# Patient Record
Sex: Female | Born: 1970
Health system: Southern US, Community
[De-identification: ages and names within clinical notes are randomized; demographics above are authoritative.]

## PROBLEM LIST (undated history)

## (undated) DIAGNOSIS — F319 Bipolar disorder, unspecified: Secondary | ICD-10-CM

## (undated) DIAGNOSIS — E785 Hyperlipidemia, unspecified: Secondary | ICD-10-CM

## (undated) DIAGNOSIS — K529 Noninfective gastroenteritis and colitis, unspecified: Secondary | ICD-10-CM

## (undated) HISTORY — DX: Noninfective gastroenteritis and colitis, unspecified: K52.9

## (undated) HISTORY — DX: Hyperlipidemia, unspecified: E78.5

## (undated) HISTORY — DX: Bipolar disorder, unspecified: F31.9

---

## 2016-08-03 ENCOUNTER — Ambulatory Visit (INDEPENDENT_AMBULATORY_CARE_PROVIDER_SITE_OTHER): Payer: BLUE CROSS/BLUE SHIELD

## 2016-08-03 ENCOUNTER — Ambulatory Visit (INDEPENDENT_AMBULATORY_CARE_PROVIDER_SITE_OTHER): Payer: BLUE CROSS/BLUE SHIELD | Admitting: Physician Assistant

## 2016-08-03 ENCOUNTER — Encounter: Payer: Self-pay | Admitting: Physician Assistant

## 2016-08-03 VITALS — BP 121/77 | HR 99 | Ht 63.0 in | Wt 160.0 lb

## 2016-08-03 DIAGNOSIS — E042 Nontoxic multinodular goiter: Secondary | ICD-10-CM | POA: Diagnosis not present

## 2016-08-03 DIAGNOSIS — F3181 Bipolar II disorder: Secondary | ICD-10-CM

## 2016-08-03 DIAGNOSIS — Z23 Encounter for immunization: Secondary | ICD-10-CM | POA: Diagnosis not present

## 2016-08-03 DIAGNOSIS — K52839 Microscopic colitis, unspecified: Secondary | ICD-10-CM

## 2016-08-03 DIAGNOSIS — L659 Nonscarring hair loss, unspecified: Secondary | ICD-10-CM | POA: Diagnosis not present

## 2016-08-03 NOTE — Addendum Note (Signed)
Addended by: Jomarie LongsBREEBACK, JADE L on: 08/03/2016 02:49 PM   Modules accepted: Orders

## 2016-08-03 NOTE — Patient Instructions (Signed)
Alopecia Areata  Alopecia areata is a type of hair loss. If you have this condition, you may lose hair on your scalp in patches. In some cases, you may lose all the hair on your scalp (alopecia totalis) or all the hair from your face and body (alopecia universalis).   Alopecia areata is an autoimmune disease. This means your body's defense system (immune system) mistakes normal parts of your body for germs or other things that can make you sick. When you have alopecia areata, your immune system attacks your hair follicles.   Alopecia areata often starts during childhood but can occur at any age. Alopecia areata is not a danger to your health but can be stressful.   CAUSES   The cause of alopecia areata is unknown.   RISK FACTORS  You may be at higher risk of alopecia areata if you:   · Have a family history of alopecia.  · Have a family history of another autoimmune disease, including type 1 diabetes and rheumatoid arthritis.  SIGNS AND SYMPTOMS  Signs of alopecia areata may include:  · Loss of scalp hair in small, round patches. These may be about the size of a quarter.  · Loss of all hair on your scalp.  · Loss of eyebrow hair, facial hair, or the hair inside your nose (nasal hair).  · Hair loss over your entire body.  DIAGNOSIS   Alopecia areata may be diagnosed by:  · Medical history and physical exam.  · Taking a sample of hair to check under a microscope.  · Taking a small piece of skin (biopsy) to examine under a microscope.  · Blood tests to rule out other autoimmune diseases.  TREATMENT   There is no cure for alopecia areata, but the disease often goes away over time. You will not lose the ability to regrow hair. Some medicines may help your hair regrow more quickly. These include:  · Corticosteroids. These block inflammation caused by your immune system. You may get this medicine as a lotion for your skin or as an injection.  · Minoxidil. This is a hair growth medicine you can use in areas of hair  loss.  · Anthralin. This is a medicine for a skin inflammation called psoriasis that may also help alopecia.  · Diphencyprone. This medicine is applied to your skin and may stimulate hair growth.  HOME CARE INSTRUCTIONS  · Use sunscreen or cover your head when outdoors.  · Take medicines only as directed by your health care provider.  · If you have lost your eyebrows, wear sunglasses outside to keep dust out of your eyes.  · If you have lost hair inside your nose, wear a kerchief over your face or apply ointment to the inside of your nose. This keeps out dust and other irritants.  · Keep all follow-up visits as directed by your health care provider. This is important.  SEEK MEDICAL CARE IF:  · Your symptoms change.  · You have new symptoms.  · You have a reaction to your medicines.  · You are struggling emotionally.     This information is not intended to replace advice given to you by your health care provider. Make sure you discuss any questions you have with your health care provider.     Document Released: 06/09/2004 Document Revised: 11/26/2014 Document Reviewed: 01/25/2014  Elsevier Interactive Patient Education ©2016 [employer]

## 2016-08-03 NOTE — Progress Notes (Addendum)
Subjective:     Patient ID: Sara Hurst, female   DOB: [birth year], 45 y.o.   MRN: [id number]  HPI Patient is a 45 y.o. Caucasian female presenting today to establish care with no acute complaints. Patient states that she recently moved from [place] and wanted to establish to be monitored for her thyroid nodule. Patient states that she was diagnosed with a thyroid nodule secondary to feeling a lump in her throat in 2005.  Patient states that she has never had any abnormal thyroid lab values or any symptoms of thyroid dysfunction. Patient reports that she had her last physical in January and is currently on her menstrual period (08/01/2016 start date). Patient states that she is having some thinning of her hair but it is not particularly worrisome to her. Patient notes that she was previously diagnosed with bipolar disorder and believes it is type 2. Patient states she has not had any symptoms since 2005 and is currently well managed on her medication regimen. Patient denies increased fatigue, fever, chest pain, palpitations, difficulty swallowing, or shortness of breath.   Review of Systems  Constitutional: Negative.   HENT: Negative.        Positive hair loss.   Eyes: Negative.   Respiratory: Negative.   Cardiovascular: Negative.   Gastrointestinal: Negative.   Endocrine: Negative.   Genitourinary: Negative.   Musculoskeletal: Negative.   Neurological: Negative.   Psychiatric/Behavioral: Negative.        Objective:   Physical Exam  Constitutional: She is oriented to person, place, and time. She appears well-developed and well-nourished. No distress.  HENT:  Head: Normocephalic and atraumatic.  Right Ear: External ear normal.  Left Ear: External ear normal.  Nose: Nose normal.  Mouth/Throat: Oropharynx is clear and moist.  Eyes: Conjunctivae and EOM are normal. Pupils are equal, round, and reactive to light.  Neck: Normal range of motion. Neck supple. No JVD present. No tracheal  deviation present. No thyromegaly present.  Cardiovascular: Normal rate, regular rhythm and intact distal pulses.  Exam reveals no gallop and no friction rub.   No murmur heard. Pulmonary/Chest: Effort normal and breath sounds normal. No stridor. No respiratory distress. She has no wheezes. She has no rales. She exhibits no tenderness.  Lymphadenopathy:    She has no cervical adenopathy.  Neurological: She is alert and oriented to person, place, and time. No cranial nerve deficit. Coordination normal.  Skin: Skin is warm and dry. No rash noted. She is not diaphoretic. No erythema. No pallor.  Psychiatric: She has a normal mood and affect. Her behavior is normal. Judgment and thought content normal.      Assessment:     Sara Hurst was seen today for establish care.  Diagnoses and all orders for this visit:  Multiple thyroid nodules -     TSH -     T4, free -     US Soft Tissue Head/Neck; Future  Influenza vaccine needed -     Flu Vaccine QUAD 36+ mos PF IM (Fluarix & Fluzone Quad PF)  Bipolar 2 disorder, major depressive episode (HCC)  Hair loss  Microscopic colitis, unspecified microscopic colitis type      Plan:     1. Multiple thyroid nodule - Patient to continue with current management including yearly ultrasound for monitoring. Patient to obtain labwork for TSH and T4 for evaluation of hair loss. Will contact patient with laboratory results.   2. Hair loss- Discussed with patient symptomatic over-the-counter treatments such as Biotin and multivitamins.  Patient advised to consider use of Rogaine if symptoms persist or worsen. Patient given electronic laboratory requisition form for TSH and T4 to evaluate potential thyroid etiology. Will continue to monitor.  3. Bipolar 2 disorder - Patient to continue current medication management with Abilify 5mg  tablets at this time. Patient referred to psychiatry for chronic medical management.   4. Microscopic colitis - Patient states that  she is well controlled at this time. Patient deferred referral to GI specialist. Will continue to monitor.  Summary - Patient received flu-shot in clinic today. Patient to follow-up in January for annual physical exam. Will call patient with laboratory and imaging results.

## 2016-08-04 LAB — TSH: TSH: 2.78 m[IU]/L

## 2016-08-04 LAB — T4, FREE: FREE T4: 1.2 ng/dL (ref 0.8–1.8)

## 2016-10-04 LAB — BASIC METABOLIC PANEL
BUN: 13 mg/dL (ref 4–21)
Creatinine: 0.8 mg/dL (ref 0.5–1.1)
Glucose: 101 mg/dL
POTASSIUM: 4.4 mmol/L (ref 3.4–5.3)
Sodium: 141 mmol/L (ref 137–147)

## 2016-10-04 LAB — HEPATIC FUNCTION PANEL
ALT: 43 U/L — AB (ref 7–35)
AST: 24 U/L (ref 13–35)
Alkaline Phosphatase: 81 U/L (ref 25–125)
Bilirubin, Total: 0.4 mg/dL

## 2016-10-04 LAB — CBC AND DIFFERENTIAL
HCT: 43 % (ref 36–46)
HEMOGLOBIN: 14.7 g/dL (ref 12.0–16.0)
Neutrophils Absolute: 5 /uL
Platelets: 190 10*3/uL (ref 150–399)
WBC: 7.5 10*3/mL

## 2016-10-04 LAB — LIPID PANEL
CHOLESTEROL: 235 mg/dL — AB (ref 0–200)
HDL: 42 mg/dL (ref 35–70)
LDL Cholesterol: 149 mg/dL
Triglycerides: 221 mg/dL — AB (ref 40–160)

## 2016-10-04 LAB — TSH: TSH: 3.07 u[IU]/mL (ref 0.41–5.90)

## 2016-10-04 LAB — HEMOGLOBIN A1C: HEMOGLOBIN A1C: 5.4

## 2017-02-04 ENCOUNTER — Encounter: Payer: Self-pay | Admitting: Physician Assistant

## 2017-02-04 ENCOUNTER — Telehealth: Payer: Self-pay | Admitting: Physician Assistant

## 2017-02-04 ENCOUNTER — Ambulatory Visit (INDEPENDENT_AMBULATORY_CARE_PROVIDER_SITE_OTHER): Payer: BLUE CROSS/BLUE SHIELD | Admitting: Physician Assistant

## 2017-02-04 VITALS — BP 125/81 | HR 98 | Temp 98.2°F | Ht 63.0 in | Wt 156.0 lb

## 2017-02-04 DIAGNOSIS — J0100 Acute maxillary sinusitis, unspecified: Secondary | ICD-10-CM

## 2017-02-04 DIAGNOSIS — F3181 Bipolar II disorder: Secondary | ICD-10-CM | POA: Diagnosis not present

## 2017-02-04 MED ORDER — AMOXICILLIN-POT CLAVULANATE 875-125 MG PO TABS: 1.0000 | ORAL_TABLET | Freq: Two times a day (BID) | ORAL | 0 refills | Status: DC

## 2017-02-04 MED ORDER — ARIPIPRAZOLE 5 MG PO TABS: 5.0000 mg | ORAL_TABLET | Freq: Every day | ORAL | 1 refills | Status: DC

## 2017-02-04 NOTE — Progress Notes (Signed)
   Subjective:    Patient ID: Sara Hurst, female    DOB: 02-14-1971, 46 y.o.   MRN: [id number]  HPI  Pt is a 46 yo female who presents to the clinic with 2 weeks of sinus pressure, facial pain, nasal congestion. She has been on mucinex, nyquil and dayquil. Denies any fever, chills, SOB or wheezing. She has productive sputum coming out of nose and throat. She has had dull headaches past few days and episodes of dizziness.   Sara Hurst pap and labs.    Review of Systems  All other systems reviewed and are negative.      Objective:   Physical Exam  Constitutional: She is oriented to person, place, and time. She appears well-developed and well-nourished.  HENT:  Head: Normocephalic and atraumatic.  Right Ear: External ear normal.  Left Ear: External ear normal.  TM's erythematous.  Oropharynx erythematous with PND.  Bilateral nasal turbinates red and swollen.  Tenderness over maxillary sinuses to palpation.   Eyes: Conjunctivae are normal. Right eye exhibits no discharge. Left eye exhibits no discharge.  Neck: Normal range of motion. Neck supple.  Cardiovascular: Normal rate, regular rhythm and normal heart sounds.   Pulmonary/Chest: Effort normal and breath sounds normal.  Lymphadenopathy:    She has no cervical adenopathy.  Neurological: She is alert and oriented to person, place, and time.  Psychiatric: She has a normal mood and affect. Her behavior is normal.          Assessment & Plan:  Marland Kitchen.Marland Kitchen.Sara Hurst was seen today for uri.  Diagnoses and all orders for this visit:  Acute non-recurrent maxillary sinusitis -     amoxicillin-clavulanate (AUGMENTIN) 875-125 MG tablet; Take 1 tablet by mouth 2 (two) times daily. Take for 10 days.  Bipolar 2 disorder, major depressive episode (HCC) -     ARIPiprazole (ABILIFY) 5 MG tablet; Take 1 tablet (5 mg total) by mouth daily.   Discussed symptomatic care of sinusitis.  Refilled abilify for 6 months.    Will call to get last pap  and lab results.   Follow up in 6 months.

## 2017-02-04 NOTE — Patient Instructions (Signed)

## 2017-02-04 NOTE — Telephone Encounter (Signed)
Pt got labs and pap done at [facility]. Can we call and get records?

## 2017-02-05 NOTE — Telephone Encounter (Signed)
Request faxed

## 2017-02-07 ENCOUNTER — Encounter: Payer: Self-pay | Admitting: Physician Assistant

## 2017-02-07 LAB — URINALYSIS
BILIRUBIN (URINE): NEGATIVE
Bilirubin (Urine): NEGATIVE
Blood, UA: NEGATIVE
Blood, UA: NEGATIVE
Glucose, UA: NEGATIVE
Glucose, UA: NEGATIVE
Ketones, UA: NEGATIVE
Ketones, UA: NEGATIVE
Leukocyte Concentration: NEGATIVE
Leukocytes, UA: NEGATIVE
Nitrite, UA: NEGATIVE
Nitrite, UA: NEGATIVE
PH: 5
PROTEIN UA: NEGATIVE
Protein, Urine: NEGATIVE
SPEC GRAV UA: 1.015
Specific Gravity, UA: 1.005
UROBILINOGEN UA: NORMAL
UROBILINOGEN UA: NORMAL
pH, UA: 5 (ref 4.5–8.0)

## 2017-02-07 LAB — UPPER ENDOSCOPY: Urine Culture, Routine: NO GROWTH

## 2017-03-15 ENCOUNTER — Telehealth: Payer: Self-pay | Admitting: *Deleted

## 2017-03-15 ENCOUNTER — Ambulatory Visit (INDEPENDENT_AMBULATORY_CARE_PROVIDER_SITE_OTHER): Payer: BLUE CROSS/BLUE SHIELD | Admitting: Physician Assistant

## 2017-03-15 VITALS — BP 128/82 | HR 92 | Ht 62.0 in | Wt 160.0 lb

## 2017-03-15 DIAGNOSIS — Z0000 Encounter for general adult medical examination without abnormal findings: Secondary | ICD-10-CM

## 2017-03-15 DIAGNOSIS — N6029 Fibroadenosis of unspecified breast: Secondary | ICD-10-CM

## 2017-03-15 DIAGNOSIS — Z23 Encounter for immunization: Secondary | ICD-10-CM

## 2017-03-15 DIAGNOSIS — Z87898 Personal history of other specified conditions: Secondary | ICD-10-CM | POA: Diagnosis not present

## 2017-03-15 NOTE — Patient Instructions (Signed)

## 2017-03-15 NOTE — Telephone Encounter (Signed)
Patient is scheduled for a CPE ( biometric screen ) today but the appointment is this afternoon. She would like the lab order placed so that she could go ahead and get labs done. Order placed

## 2017-03-15 NOTE — Progress Notes (Signed)
   Subjective:    Patient ID: Sara Hurst, female    DOB: [birth year], 46 y.o.   MRN: [id number]  HPI    Review of Systems     Objective:   Physical Exam        Assessment & Plan:   Subjective:     Sara Hurst is a 46 y.o. female and is here for a comprehensive physical exam. The patient reports no problems.  Social History   Social History  . Marital status: Single    Spouse name: N/A  . Number of children: N/A  . Years of education: N/A   Occupational History  . Not on file.   Social History Main Topics  . Smoking status: Never Smoker  . Smokeless tobacco: Never Used  . Alcohol use Yes  . Drug use: No  . Sexual activity: Yes   Other Topics Concern  . Not on file   Social History Narrative  . No narrative on file   Health Maintenance  Topic Date Due  . HIV Screening  03/15/2018 (Originally 06/05/1986)  . INFLUENZA VACCINE  06/19/2017  . PAP SMEAR  09/12/2019  . TETANUS/TDAP  03/16/2027      Review of Systems A comprehensive review of systems was negative.   Objective:    BP 128/82   Pulse 92   Ht  (1.575 m)   Wt 160 lb (72.6 kg)   BMI 29.26 kg/m  General appearance: alert, cooperative and appears stated age Head: Normocephalic, without obvious abnormality, atraumatic Eyes: conjunctivae/corneas clear. PERRL, EOM's intact. Fundi benign. Ears: normal TM's and external ear canals both ears Nose: Nares normal. Septum midline. Mucosa normal. No drainage or sinus tenderness. Throat: lips, mucosa, and tongue normal; teeth and gums normal Neck: no adenopathy, no carotid bruit, no JVD, supple, symmetrical, trachea midline and thyroid not enlarged, symmetric, no tenderness/mass/nodules Back: symmetric, no curvature. ROM normal. No CVA tenderness. Lungs: clear to auscultation bilaterally Breasts: normal appearance, no masses or tenderness Heart: regular rate and rhythm, S1, S2 normal, no murmur, click, rub or gallop Abdomen: soft,  non-tender; bowel sounds normal; no masses,  no organomegaly Extremities: extremities normal, atraumatic, no cyanosis or edema Pulses: 2+ and symmetric Skin: Skin color, texture, turgor normal. No rashes or lesions Lymph nodes: Cervical, supraclavicular, and axillary nodes normal. Neurologic: Alert and oriented X 3, normal strength and tone. Normal symmetric reflexes. Normal coordination and gait    Assessment:    Healthy female exam.      Plan:    Marland KitchenMarland KitchenAlisse was seen today for annual exam.  Diagnoses and all orders for this visit:  Routine physical examination  Immunization due  Segmental fibroadenosis of breast, unspecified laterality -     MM DIAG BREAST TOMO BILATERAL; Future  History of abnormal mammogram -     MM DIAG BREAST TOMO BILATERAL; Future  Other orders -     Tdap vaccine greater than or equal to 7yo IM  .Marland Kitchen Depression screen [facility] 2/9 03/15/2017 02/04/2017  Decreased Interest 0 0  Down, Depressed, Hopeless 0 0  PHQ - 2 Score 0 0    Discussed weight loss and plan.  Encouraged vitamin D1000 units and calcium 1300.  See After Visit Summary for Counseling Recommendations

## 2017-03-16 LAB — CBC
HCT: 42.5 % (ref 35.0–45.0)
Hemoglobin: 14.2 g/dL (ref 11.7–15.5)
MCH: 29.9 pg (ref 27.0–33.0)
MCHC: 33.4 g/dL (ref 32.0–36.0)
MCV: 89.5 fL (ref 80.0–100.0)
MPV: 11 fL (ref 7.5–12.5)
Platelets: 212 10*3/uL (ref 140–400)
RBC: 4.75 MIL/uL (ref 3.80–5.10)
RDW: 13 % (ref 11.0–15.0)
WBC: 10.6 10*3/uL (ref 3.8–10.8)

## 2017-03-16 LAB — COMPLETE METABOLIC PANEL WITH GFR
AG RATIO: 1.6 ratio (ref 1.0–2.5)
ALBUMIN: 4.4 g/dL (ref 3.6–5.1)
ALK PHOS: 48 U/L (ref 33–115)
ALT: 17 U/L (ref 6–29)
AST: 17 U/L (ref 10–35)
BILIRUBIN TOTAL: 0.6 mg/dL (ref 0.2–1.2)
BUN/Creatinine Ratio: 13.2 Ratio (ref 6–22)
BUN: 10 mg/dL (ref 7–25)
CALCIUM: 9.5 mg/dL (ref 8.6–10.2)
CO2: 26 mmol/L (ref 20–31)
CREATININE: 0.76 mg/dL (ref 0.50–1.10)
Chloride: 100 mmol/L (ref 98–110)
GFR, Est African American: >89 mL/min (ref >=60)
GLOBULIN: 2.7 g/dL (ref 1.9–3.7)
GLUCOSE: 81 mg/dL (ref 65–99)
Potassium: 3.9 mmol/L (ref 3.5–5.3)
Sodium: 136 mmol/L (ref 135–146)
TOTAL PROTEIN: 7.1 g/dL (ref 6.1–8.1)

## 2017-03-16 LAB — LIPID PANEL
CHOLESTEROL: 232 mg/dL — AB (ref <200)
HDL: 40 mg/dL — ABNORMAL LOW (ref >50)
LDL CALC: 131 mg/dL — AB (ref <100)
TRIGLYCERIDES: 303 mg/dL — AB (ref <150)
Total CHOL/HDL Ratio: 5.8 Ratio — ABNORMAL HIGH (ref <5.0)
VLDL: 61 mg/dL — ABNORMAL HIGH (ref <30)

## 2017-03-17 ENCOUNTER — Encounter: Payer: Self-pay | Admitting: Physician Assistant

## 2017-03-18 ENCOUNTER — Encounter: Payer: Self-pay | Admitting: Physician Assistant

## 2017-03-18 DIAGNOSIS — E781 Pure hyperglyceridemia: Secondary | ICD-10-CM | POA: Insufficient documentation

## 2017-03-18 NOTE — Telephone Encounter (Signed)
Call pt: LdL improved from last year. TG worsened. Certainly Need Fish Oil  for TG. Recheck in 1 year. Low carb/low sugar.

## 2017-05-16 ENCOUNTER — Encounter: Payer: Self-pay | Admitting: Physician Assistant

## 2017-07-05 ENCOUNTER — Encounter: Payer: Self-pay | Admitting: Physician Assistant

## 2017-07-05 ENCOUNTER — Ambulatory Visit (INDEPENDENT_AMBULATORY_CARE_PROVIDER_SITE_OTHER): Payer: BLUE CROSS/BLUE SHIELD | Admitting: Physician Assistant

## 2017-07-05 ENCOUNTER — Ambulatory Visit (INDEPENDENT_AMBULATORY_CARE_PROVIDER_SITE_OTHER): Payer: BLUE CROSS/BLUE SHIELD

## 2017-07-05 VITALS — BP 121/78 | HR 90 | Wt 159.0 lb

## 2017-07-05 DIAGNOSIS — R253 Fasciculation: Secondary | ICD-10-CM | POA: Diagnosis not present

## 2017-07-05 DIAGNOSIS — G43109 Migraine with aura, not intractable, without status migrainosus: Secondary | ICD-10-CM

## 2017-07-05 DIAGNOSIS — L299 Pruritus, unspecified: Secondary | ICD-10-CM | POA: Diagnosis not present

## 2017-07-05 DIAGNOSIS — M542 Cervicalgia: Secondary | ICD-10-CM

## 2017-07-05 DIAGNOSIS — G44209 Tension-type headache, unspecified, not intractable: Secondary | ICD-10-CM | POA: Diagnosis not present

## 2017-07-05 DIAGNOSIS — H539 Unspecified visual disturbance: Secondary | ICD-10-CM | POA: Diagnosis not present

## 2017-07-05 DIAGNOSIS — Z820 Family history of epilepsy and other diseases of the nervous system: Secondary | ICD-10-CM

## 2017-07-05 MED ORDER — CYCLOBENZAPRINE HCL 10 MG PO TABS: 10.0000 mg | ORAL_TABLET | Freq: Three times a day (TID) | ORAL | 0 refills | Status: DC | PRN

## 2017-07-05 NOTE — Progress Notes (Signed)
   Subjective:    Patient ID: Sara Hurst, female    DOB: [birth year], 46 y.o.   MRN: [id number]  HPI  Pt is a 46 yo female who presents to the clinic today with multiple concerning symptoms. Overall her mother has MS and she is concerned she may have it. For the last 6 months she has had on and off different symptoms. Her first symptoms was bilateraly itchy legs without rash that is worse at night, she has noticed her farsightedness has gotten worse, she has had a stiff neck with no injury or activity changes(she does have cervical DDD), she has had new headaches that are "like a band around her head" and  auras with no hx of migraines and for the last week she has had a jaw twitch. She denies any tripping, dropping things or loss of muscle coordination. She tried stopping abilify and did not resolve any symptoms.   .. Active Ambulatory Problems    Diagnosis Date Noted  . Microscopic colitis 08/03/2016  . Hair loss 08/03/2016  . Bipolar 2 disorder, major depressive episode (HCC) 08/03/2016  . Multiple thyroid nodules 08/03/2016  . Hypertriglyceridemia 03/18/2017  . DDD (degenerative disc disease), cervical 07/08/2017  . Migraine with aura and without status migrainosus, not intractable 07/09/2017  . Itching 07/09/2017  . Twitching 07/09/2017  . Family history of MS (multiple sclerosis) 07/09/2017  . Neck pain 07/09/2017  . Vision changes 07/09/2017   Resolved Ambulatory Problems    Diagnosis Date Noted  . No Resolved Ambulatory Problems   Past Medical History:  Diagnosis Date  . Bipolar disorder (HCC)   . Colitis   . Hyperlipidemia      Review of Systems    see HPI.  Objective:   Physical Exam  Constitutional: She is oriented to person, place, and time. She appears well-developed and well-nourished.  HENT:  Head: Normocephalic and atraumatic.  Eyes: Pupils are equal, round, and reactive to light. Conjunctivae and EOM are normal.  Neck: Normal range of motion. Neck  supple.  Cardiovascular: Normal rate, regular rhythm and normal heart sounds.   Pulmonary/Chest: Effort normal and breath sounds normal.  Neurological: She is alert and oriented to person, place, and time. She has normal reflexes. She displays normal reflexes. No cranial nerve deficit. Coordination normal.  Psychiatric: She has a normal mood and affect. Her behavior is normal.          Assessment & Plan:  Marland KitchenMarland KitchenAbrianna was seen today for headache and jaw twitching.  Diagnoses and all orders for this visit:  Neck pain -     cyclobenzaprine (FLEXERIL) 10 MG tablet; Take 1 tablet (10 mg total) by mouth 3 (three) times daily as needed for muscle spasms. -     DG Cervical Spine Complete; Future  Family history of MS (multiple sclerosis)  Twitching  Itching  Migraine with aura and without status migrainosus, not intractable  Vision changes   Xray confirmed DDD. Discussed muscle relaxer, massage therapy, biofreeze, heat and ice.  Would like for patient to have annual eye exam due to vision changes.  Due to family hx and numerous symptoms will get MRI to evaluate for MS.

## 2017-07-08 ENCOUNTER — Encounter: Payer: Self-pay | Admitting: Physician Assistant

## 2017-07-08 DIAGNOSIS — M5030 Other cervical disc degeneration, unspecified cervical region: Secondary | ICD-10-CM | POA: Insufficient documentation

## 2017-07-09 DIAGNOSIS — H539 Unspecified visual disturbance: Secondary | ICD-10-CM | POA: Insufficient documentation

## 2017-07-09 DIAGNOSIS — Z820 Family history of epilepsy and other diseases of the nervous system: Secondary | ICD-10-CM | POA: Insufficient documentation

## 2017-07-09 DIAGNOSIS — M542 Cervicalgia: Secondary | ICD-10-CM | POA: Insufficient documentation

## 2017-07-09 DIAGNOSIS — R253 Fasciculation: Secondary | ICD-10-CM | POA: Insufficient documentation

## 2017-07-09 DIAGNOSIS — L299 Pruritus, unspecified: Secondary | ICD-10-CM | POA: Insufficient documentation

## 2017-07-09 DIAGNOSIS — G43109 Migraine with aura, not intractable, without status migrainosus: Secondary | ICD-10-CM | POA: Insufficient documentation

## 2017-07-09 NOTE — Progress Notes (Signed)
MRI ordered

## 2017-07-11 ENCOUNTER — Encounter: Payer: Self-pay | Admitting: Physician Assistant

## 2017-07-18 ENCOUNTER — Telehealth: Payer: Self-pay | Admitting: Physician Assistant

## 2017-07-18 NOTE — Telephone Encounter (Signed)
[facility] adv they [facility] over Mri results yesterday to our office [facility] and she has Mri done Tuesday and she is requesting to know her results. Pt req a call back [phone]. Thanks

## 2017-07-19 NOTE — Telephone Encounter (Signed)
Who did you give th paper copy too? I will call the patient and let her know results are normal. Thanks

## 2017-07-19 NOTE — Telephone Encounter (Signed)
I gave you paper copy but results are completely normal!!!

## 2017-08-16 ENCOUNTER — Encounter: Payer: Self-pay | Admitting: Physician Assistant

## 2017-08-16 ENCOUNTER — Ambulatory Visit (INDEPENDENT_AMBULATORY_CARE_PROVIDER_SITE_OTHER): Payer: BLUE CROSS/BLUE SHIELD | Admitting: Physician Assistant

## 2017-08-16 VITALS — BP 118/71 | HR 79 | Wt 162.0 lb

## 2017-08-16 DIAGNOSIS — G43109 Migraine with aura, not intractable, without status migrainosus: Secondary | ICD-10-CM | POA: Diagnosis not present

## 2017-08-16 DIAGNOSIS — Z820 Family history of epilepsy and other diseases of the nervous system: Secondary | ICD-10-CM

## 2017-08-16 DIAGNOSIS — H5712 Ocular pain, left eye: Secondary | ICD-10-CM

## 2017-08-16 DIAGNOSIS — R238 Other skin changes: Secondary | ICD-10-CM

## 2017-08-16 DIAGNOSIS — R253 Fasciculation: Secondary | ICD-10-CM

## 2017-08-16 MED ORDER — AMOXICILLIN-POT CLAVULANATE 875-125 MG PO TABS: 1.0000 | ORAL_TABLET | Freq: Two times a day (BID) | ORAL | 0 refills | Status: DC

## 2017-08-16 MED ORDER — JUNEL FE 1/20 1-20 MG-MCG PO TABS: 1.0000 | ORAL_TABLET | Freq: Every day | ORAL | 11 refills | Status: DC

## 2017-08-16 MED ORDER — PREDNISONE 50 MG PO TABS: ORAL_TABLET | ORAL | 0 refills | Status: DC

## 2017-08-16 NOTE — Progress Notes (Signed)
   Subjective:    Patient ID: Sara Hurst, female    DOB: 03/15/1971, 46 y.o.   MRN: [id number]  HPI  Pt is a 46 yo female who presents to the clinic to follow after MRI and eye exam for left eye pain, jaw twitching, migraines. She continues to have these symptoms. Her mother has MS and her paternal grandfather had parkinsons. She continues to be concerned. MrI no acute findings and opthmologist great exam.   .. Active Ambulatory Problems    Diagnosis Date Noted  . Microscopic colitis 08/03/2016  . Hair loss 08/03/2016  . Bipolar 2 disorder, major depressive episode (HCC) 08/03/2016  . Multiple thyroid nodules 08/03/2016  . Hypertriglyceridemia 03/18/2017  . DDD (degenerative disc disease), cervical 07/08/2017  . Migraine with aura and without status migrainosus, not intractable 07/09/2017  . Itching 07/09/2017  . Twitching 07/09/2017  . Family history of MS (multiple sclerosis) 07/09/2017  . Neck pain 07/09/2017  . Vision changes 07/09/2017  . Papule 08/18/2017   Resolved Ambulatory Problems    Diagnosis Date Noted  . No Resolved Ambulatory Problems   Past Medical History:  Diagnosis Date  . Bipolar disorder (HCC)   . Colitis   . Hyperlipidemia      Review of Systems  All other systems reviewed and are negative.      Objective:   Physical Exam  Constitutional: She is oriented to person, place, and time. She appears well-developed and well-nourished.  HENT:  Head: Normocephalic and atraumatic.  Cardiovascular: Normal rate, regular rhythm and normal heart sounds.   Pulmonary/Chest: Effort normal and breath sounds normal.  Neurological: She is alert and oriented to person, place, and time.  Skin:  4 papules with what appears to be some telectangsia surrounding in a linear pattern on nose.   Psychiatric: She has a normal mood and affect. Her behavior is normal.          Assessment & Plan:  Sara KitchenMarland KitchenDemetria was seen today for f/u mri.  Diagnoses and all orders for  this visit:  Left eye pain -     amoxicillin-clavulanate (AUGMENTIN) 875-125 MG tablet; Take 1 tablet by mouth 2 (two) times daily. For 10 days. -     predniSONE (DELTASONE) 50 MG tablet; Take one tablet for 5 days. -     Ambulatory referral to Neurology  Papule -     Ambulatory referral to Dermatology  Twitching -     Ambulatory referral to Neurology  Migraine with aura and without status migrainosus, not intractable -     amoxicillin-clavulanate (AUGMENTIN) 875-125 MG tablet; Take 1 tablet by mouth 2 (two) times daily. For 10 days. -     predniSONE (DELTASONE) 50 MG tablet; Take one tablet for 5 days. -     Ambulatory referral to Neurology  Family history of Parkinson disease -     Ambulatory referral to Neurology  Family history of MS (multiple sclerosis) -     Ambulatory referral to Neurology  Other orders -     JUNEL FE 1/20 1-20 MG-MCG tablet; Take 1 tablet by mouth daily.  reassurance given MRI and eye exam were normal. Symptoms could represent sinus infection. I would like to treat. Referral made to neurology due to family hx.   Concern for papules on nose to be BCC. Made referral.   Pt did need me to send rx for OCP it needs to be written BRAND only to get what she has always been on.

## 2017-08-18 ENCOUNTER — Encounter: Payer: Self-pay | Admitting: Physician Assistant

## 2017-08-18 DIAGNOSIS — R238 Other skin changes: Secondary | ICD-10-CM | POA: Insufficient documentation

## 2017-08-27 ENCOUNTER — Other Ambulatory Visit: Payer: Self-pay | Admitting: Physician Assistant

## 2017-08-27 ENCOUNTER — Encounter: Payer: Self-pay | Admitting: Physician Assistant

## 2017-08-27 DIAGNOSIS — Z87898 Personal history of other specified conditions: Secondary | ICD-10-CM

## 2017-08-27 DIAGNOSIS — N6029 Fibroadenosis of unspecified breast: Secondary | ICD-10-CM

## 2017-09-13 ENCOUNTER — Ambulatory Visit
Admission: RE | Admit: 2017-09-13 | Discharge: 2017-09-13 | Disposition: A | Discharge status: Home / Self Care | Payer: BLUE CROSS/BLUE SHIELD | Source: Ambulatory Visit | Attending: Physician Assistant | Admitting: Physician Assistant

## 2017-09-13 ENCOUNTER — Other Ambulatory Visit: Payer: Self-pay | Admitting: Physician Assistant

## 2017-09-13 ENCOUNTER — Encounter: Payer: Self-pay | Admitting: Physician Assistant

## 2017-09-13 DIAGNOSIS — N6029 Fibroadenosis of unspecified breast: Secondary | ICD-10-CM

## 2017-09-13 DIAGNOSIS — Z87898 Personal history of other specified conditions: Secondary | ICD-10-CM

## 2017-09-13 DIAGNOSIS — N6310 Unspecified lump in the right breast, unspecified quadrant: Secondary | ICD-10-CM

## 2017-09-17 ENCOUNTER — Other Ambulatory Visit: Payer: Self-pay | Admitting: Physician Assistant

## 2017-09-17 DIAGNOSIS — N6310 Unspecified lump in the right breast, unspecified quadrant: Secondary | ICD-10-CM

## 2017-09-18 ENCOUNTER — Other Ambulatory Visit: Payer: Self-pay | Admitting: Physician Assistant

## 2017-09-18 ENCOUNTER — Ambulatory Visit
Admission: RE | Admit: 2017-09-18 | Discharge: 2017-09-18 | Disposition: A | Discharge status: Home / Self Care | Payer: BLUE CROSS/BLUE SHIELD | Source: Ambulatory Visit | Attending: Physician Assistant | Admitting: Physician Assistant

## 2017-09-18 DIAGNOSIS — N6310 Unspecified lump in the right breast, unspecified quadrant: Secondary | ICD-10-CM

## 2018-02-26 ENCOUNTER — Other Ambulatory Visit: Payer: Self-pay | Admitting: Physician Assistant

## 2018-02-26 DIAGNOSIS — F3181 Bipolar II disorder: Secondary | ICD-10-CM

## 2018-03-12 ENCOUNTER — Other Ambulatory Visit: Payer: Self-pay | Admitting: *Deleted

## 2018-03-12 DIAGNOSIS — F3181 Bipolar II disorder: Secondary | ICD-10-CM

## 2018-03-12 MED ORDER — ARIPIPRAZOLE 5 MG PO TABS: 5.0000 mg | ORAL_TABLET | Freq: Every day | ORAL | 0 refills | Status: DC

## 2018-03-21 ENCOUNTER — Ambulatory Visit: Payer: BLUE CROSS/BLUE SHIELD | Admitting: Physician Assistant

## 2018-03-25 ENCOUNTER — Encounter: Payer: Self-pay | Admitting: Physician Assistant

## 2018-03-25 ENCOUNTER — Ambulatory Visit (INDEPENDENT_AMBULATORY_CARE_PROVIDER_SITE_OTHER): Payer: BLUE CROSS/BLUE SHIELD | Admitting: Physician Assistant

## 2018-03-25 VITALS — BP 132/74 | HR 97 | Ht 62.0 in | Wt 163.0 lb

## 2018-03-25 DIAGNOSIS — Z0000 Encounter for general adult medical examination without abnormal findings: Secondary | ICD-10-CM

## 2018-03-25 DIAGNOSIS — Z3009 Encounter for other general counseling and advice on contraception: Secondary | ICD-10-CM | POA: Diagnosis not present

## 2018-03-25 DIAGNOSIS — E042 Nontoxic multinodular goiter: Secondary | ICD-10-CM

## 2018-03-25 DIAGNOSIS — Z13220 Encounter for screening for lipoid disorders: Secondary | ICD-10-CM

## 2018-03-25 DIAGNOSIS — Z131 Encounter for screening for diabetes mellitus: Secondary | ICD-10-CM | POA: Diagnosis not present

## 2018-03-25 DIAGNOSIS — R1032 Left lower quadrant pain: Secondary | ICD-10-CM

## 2018-03-25 DIAGNOSIS — F3181 Bipolar II disorder: Secondary | ICD-10-CM | POA: Diagnosis not present

## 2018-03-25 MED ORDER — ARIPIPRAZOLE 5 MG PO TABS: 5.0000 mg | ORAL_TABLET | Freq: Every day | ORAL | 3 refills | Status: DC

## 2018-03-25 MED ORDER — CIPROFLOXACIN HCL 500 MG PO TABS: 500.0000 mg | ORAL_TABLET | Freq: Two times a day (BID) | ORAL | 0 refills | Status: DC

## 2018-03-25 MED ORDER — METRONIDAZOLE 500 MG PO TABS: 500.0000 mg | ORAL_TABLET | Freq: Three times a day (TID) | ORAL | 0 refills | Status: DC

## 2018-03-25 NOTE — Patient Instructions (Signed)
Diverticulitis °Diverticulitis is infection or inflammation of small pouches (diverticula) in the colon that form due to a condition called diverticulosis. Diverticula can trap stool (feces) and bacteria, causing infection and inflammation. °Diverticulitis may cause severe stomach pain and diarrhea. It may lead to tissue damage in the colon that causes bleeding. The diverticula may also burst (rupture) and cause infected stool to enter other areas of the abdomen. °Complications of diverticulitis can include: °· Bleeding. °· Severe infection. °· Severe pain. °· Rupture (perforation) of the colon. °· Blockage (obstruction) of the colon. ° °What are the causes? °This condition is caused by stool becoming trapped in the diverticula, which allows bacteria to grow in the diverticula. This leads to inflammation and infection. °What increases the risk? °You are more likely to develop this condition if: °· You have diverticulosis. The risk for diverticulosis increases if: °? You are overweight or obese. °? You use tobacco products. °? You do not get enough exercise. °· You eat a diet that does not include enough fiber. High-fiber foods include fruits, vegetables, beans, nuts, and whole grains. ° °What are the signs or symptoms? °Symptoms of this condition may include: °· Pain and tenderness in the abdomen. The pain is normally located on the left side of the abdomen, but it may occur in other areas. °· Fever and chills. °· Bloating. °· Cramping. °· Nausea. °· Vomiting. °· Changes in bowel routines. °· Blood in your stool. ° °How is this diagnosed? °This condition is diagnosed based on: °· Your medical history. °· A physical exam. °· Tests to make sure there is nothing else causing your condition. These tests may include: °? Blood tests. °? Urine tests. °? Imaging tests of the abdomen, including X-rays, ultrasounds, MRIs, or CT scans. ° °How is this treated? °Most cases of this condition are mild and can be treated at home.  Treatment may include: °· Taking over-the-counter pain medicines. °· Following a clear liquid diet. °· Taking antibiotic medicines by mouth. °· Rest. ° °More severe cases may need to be treated at a hospital. Treatment may include: °· Not eating or drinking. °· Taking prescription pain medicine. °· Receiving antibiotic medicines through an IV tube. °· Receiving fluids and nutrition through an IV tube. °· Surgery. ° °When your condition is under control, your health care provider may recommend that you have a colonoscopy. This is an exam to look at the entire large intestine. During the exam, a lubricated, bendable tube is inserted into the anus and then passed into the rectum, colon, and other parts of the large intestine. A colonoscopy can show how severe your diverticula are and whether something else may be causing your symptoms. °Follow these instructions at home: °Medicines °· Take over-the-counter and prescription medicines only as told by your health care provider. These include fiber supplements, probiotics, and stool softeners. °· If you were prescribed an antibiotic medicine, take it as told by your health care provider. Do not stop taking the antibiotic even if you start to feel better. °· Do not drive or use heavy machinery while taking prescription pain medicine. °General instructions °· Follow a full liquid diet or another diet as directed by your health care provider. After your symptoms improve, your health care provider may tell you to change your diet. He or she may recommend that you eat a diet that contains at least 25 g (25 grams) of fiber daily. Fiber makes it easier to pass stool. Healthy sources of fiber include: °? Berries. One cup   contains 4-8 grams of fiber. °? Beans or lentils. One half cup contains 5-8 grams of fiber. °? Green vegetables. One cup contains 4 grams of fiber. °· Exercise for at least 30 minutes, 3 times each week. You should exercise hard enough to raise your heart rate and  break a sweat. °· Keep all follow-up visits as told by your health care provider. This is important. You may need a colonoscopy. °Contact a health care provider if: °· Your pain does not improve. °· You have a hard time drinking or eating food. °· Your bowel movements do not return to normal. °Get help right away if: °· Your pain gets worse. °· Your symptoms do not get better with treatment. °· Your symptoms suddenly get worse. °· You have a fever. °· You vomit more than one time. °· You have stools that are bloody, black, or tarry. °Summary °· Diverticulitis is infection or inflammation of small pouches (diverticula) in the colon that form due to a condition called diverticulosis. Diverticula can trap stool (feces) and bacteria, causing infection and inflammation. °· You are at higher risk for this condition if you have diverticulosis and you eat a diet that does not include enough fiber. °· Most cases of this condition are mild and can be treated at home. More severe cases may need to be treated at a hospital. °· When your condition is under control, your health care provider may recommend that you have an exam called a colonoscopy. This exam can show how severe your diverticula are and whether something else may be causing your symptoms. °This information is not intended to replace advice given to you by your health care provider. Make sure you discuss any questions you have with your health care provider. °Document Released: 08/15/2005 Document Revised: 12/08/2016 Document Reviewed: 12/08/2016 °Elsevier Interactive Patient Education © 2018 [employer] ° °

## 2018-03-25 NOTE — Progress Notes (Signed)
j

## 2018-03-26 ENCOUNTER — Encounter: Payer: Self-pay | Admitting: Physician Assistant

## 2018-03-26 DIAGNOSIS — Z3009 Encounter for other general counseling and advice on contraception: Secondary | ICD-10-CM | POA: Insufficient documentation

## 2018-03-26 DIAGNOSIS — R1032 Left lower quadrant pain: Secondary | ICD-10-CM | POA: Insufficient documentation

## 2018-03-26 NOTE — Progress Notes (Signed)
Subjective:    Patient ID: Sara Hurst, female    DOB: 01/23/1971, 47 y.o.   MRN: [id number]  HPI Pt is a 47 yo female who presents to the clinic for follow up on bipolar 2.   Bipolar 2 is doing great. Well controlled on abilify without any problems.   She questions going off OCP. She has been on for many years and just wonders if she should stop. She is having some left lower quadrant pain and saw GYN. She has U/S ordered on 21st of May. Hx of microscopic colitis with last colonoscopy in her early 32's. She states symptoms do not feel like colitis. She is concerned about diverticulitis. Her mother had diverticulitis at one point. No fever, chills, body aches. Her bowel movements vary from loose to hard. No blood in stool. She did notice she ate peanuts for that last 2 days and made her stomach pains much worse.   .. Active Ambulatory Problems    Diagnosis Date Noted  . Microscopic colitis 08/03/2016  . Hair loss 08/03/2016  . Bipolar 2 disorder, major depressive episode (HCC) 08/03/2016  . Multiple thyroid nodules 08/03/2016  . Hypertriglyceridemia 03/18/2017  . DDD (degenerative disc disease), cervical 07/08/2017  . Migraine with aura and without status migrainosus, not intractable 07/09/2017  . Itching 07/09/2017  . Twitching 07/09/2017  . Family history of MS (multiple sclerosis) 07/09/2017  . Neck pain 07/09/2017  . Vision changes 07/09/2017  . Papule 08/18/2017  . Breast mass, right 09/13/2017  . Birth control counseling 03/26/2018  . Left lower quadrant pain 03/26/2018   Resolved Ambulatory Problems    Diagnosis Date Noted  . No Resolved Ambulatory Problems   Past Medical History:  Diagnosis Date  . Bipolar disorder (HCC)   . Colitis   . Hyperlipidemia      Review of Systems  All other systems reviewed and are negative.  See HPI.     Objective:   Physical Exam  Constitutional: She appears well-developed and well-nourished.  HENT:  Head: Normocephalic  and atraumatic.  Neck: Normal range of motion. Neck supple.  Pulmonary/Chest: Effort normal and breath sounds normal.  Abdominal: Soft. Bowel sounds are normal. She exhibits no distension and no mass. There is tenderness. There is no rebound and no guarding. No hernia.  Psychiatric: She has a normal mood and affect. Her behavior is normal.          Assessment & Plan:  Marland KitchenMarland KitchenDiagnoses and all orders for this visit:  Left lower quadrant pain -     CBC with Differential/Platelet -     COMPLETE METABOLIC PANEL WITH GFR -     ciprofloxacin (CIPRO) 500 MG tablet; Take 1 tablet (500 mg total) by mouth 2 (two) times daily. For 10 days. -     metroNIDAZOLE (FLAGYL) 500 MG tablet; Take 1 tablet (500 mg total) by mouth 3 (three) times daily. For 10 days.  Screening for diabetes mellitus -     COMPLETE METABOLIC PANEL WITH GFR  Screening for lipid disorders -     Lipid Panel w/reflex Direct LDL  Preventative health care -     CBC with Differential/Platelet -     COMPLETE METABOLIC PANEL WITH GFR -     Lipid Panel w/reflex Direct LDL -     TSH -     Vitamin D 1,25 dihydroxy -     B12 and Folate Panel  Bipolar 2 disorder, major depressive episode (HCC) -  ARIPiprazole (ABILIFY) 5 MG tablet; Take 1 tablet (5 mg total) by mouth daily.  Multiple thyroid nodules -     TSH  .Marland Kitchen Depression screen [facility] 2/9 03/25/2018 07/05/2017 03/15/2017 02/04/2017  Decreased Interest 0 0 0 0  Down, Depressed, Hopeless 0 0 0 0  PHQ - 2 Score 0 0 0 0   Discussed OCP and ok to stop and then if she wants hormones tested we can after at least 2 months off OCP.   Mood controlled abilify refilled.   LLQ pain concerning due to hx of colitis and family hx of diverticulitis for a personal diverticulitis possibilty. Currently we will start probiotic and change diet up a bit. She leaves for mission trip to [place] on Friday. Cipro/flagyl given if symptoms of diverticulitis worsen. Given HO. Will hold on CT now. Last  colonoscopy in 40's may need to consider colonoscopy if symptoms persist and to see if has diverticulosis. Cbc ordered.   Labs ordered for prevention.

## 2018-03-28 NOTE — Progress Notes (Signed)
Call pt: WBC looks good no sign of infection. Thyroid stable.  b12 low normal. I would start sublingual daily.  Vitamin D pending.   Cholesterol continues to worsen. TG high, LDL elevated. HDL too low. Certainly try fish oil  daily. Need to really watch fats in diet. Lets recheck in 6 months. We may need to consider medication if numbers do not decrease to lessen your cardiovascular risk.

## 2018-03-29 LAB — CBC WITH DIFFERENTIAL/PLATELET
BASOS PCT: 0.5 %
Basophils Absolute: 32 cells/uL (ref 0–200)
EOS ABS: 269 {cells}/uL (ref 15–500)
EOS PCT: 4.2 %
HCT: 41.6 % (ref 35.0–45.0)
HEMOGLOBIN: 14.3 g/dL (ref 11.7–15.5)
Lymphs Abs: 2144 cells/uL (ref 850–3900)
MCH: 30.2 pg (ref 27.0–33.0)
MCHC: 34.4 g/dL (ref 32.0–36.0)
MCV: 87.9 fL (ref 80.0–100.0)
MONOS PCT: 6.8 %
MPV: 11.2 fL (ref 7.5–12.5)
NEUTROS ABS: 3520 {cells}/uL (ref 1500–7800)
Neutrophils Relative %: 55 %
PLATELETS: 245 10*3/uL (ref 140–400)
RBC: 4.73 10*6/uL (ref 3.80–5.10)
RDW: 12.5 % (ref 11.0–15.0)
TOTAL LYMPHOCYTE: 33.5 %
WBC mixed population: 435 cells/uL (ref 200–950)
WBC: 6.4 10*3/uL (ref 3.8–10.8)

## 2018-03-29 LAB — COMPLETE METABOLIC PANEL WITH GFR
AG RATIO: 1.6 (calc) (ref 1.0–2.5)
ALBUMIN MSPROF: 4.3 g/dL (ref 3.6–5.1)
ALKALINE PHOSPHATASE (APISO): 51 U/L (ref 33–115)
ALT: 13 U/L (ref 6–29)
AST: 15 U/L (ref 10–35)
BILIRUBIN TOTAL: 0.5 mg/dL (ref 0.2–1.2)
BUN: 16 mg/dL (ref 7–25)
CHLORIDE: 105 mmol/L (ref 98–110)
CO2: 25 mmol/L (ref 20–32)
Calcium: 9.5 mg/dL (ref 8.6–10.2)
Creat: 0.89 mg/dL (ref 0.50–1.10)
GFR, EST AFRICAN AMERICAN: 90 mL/min/{1.73_m2} (ref >=60)
GFR, Est Non African American: 78 mL/min/{1.73_m2} (ref >=60)
Globulin: 2.7 g/dL (calc) (ref 1.9–3.7)
Glucose, Bld: 106 mg/dL — ABNORMAL HIGH (ref 65–99)
POTASSIUM: 4.4 mmol/L (ref 3.5–5.3)
Sodium: 140 mmol/L (ref 135–146)
TOTAL PROTEIN: 7 g/dL (ref 6.1–8.1)

## 2018-03-29 LAB — B12 AND FOLATE PANEL
FOLATE: 12.9 ng/mL
Vitamin B-12: 333 pg/mL (ref 200–1100)

## 2018-03-29 LAB — LIPID PANEL W/REFLEX DIRECT LDL
CHOLESTEROL: 257 mg/dL — AB (ref <200)
HDL: 41 mg/dL — ABNORMAL LOW (ref >50)
LDL Cholesterol (Calc): 163 mg/dL (calc) — ABNORMAL HIGH
Non-HDL Cholesterol (Calc): 216 mg/dL (calc) — ABNORMAL HIGH (ref <130)
Total CHOL/HDL Ratio: 6.3 (calc) — ABNORMAL HIGH (ref <5.0)
Triglycerides: 343 mg/dL — ABNORMAL HIGH (ref <150)

## 2018-03-29 LAB — VITAMIN D 1,25 DIHYDROXY
Vitamin D 1, 25 (OH)2 Total: 44 pg/mL (ref 18–72)
Vitamin D3 1, 25 (OH)2: 44 pg/mL

## 2018-03-29 LAB — TSH: TSH: 3.45 m[IU]/L

## 2018-03-30 NOTE — Progress Notes (Signed)
Call pt: vitamin d looks good.

## 2018-04-07 ENCOUNTER — Ambulatory Visit (INDEPENDENT_AMBULATORY_CARE_PROVIDER_SITE_OTHER): Payer: BLUE CROSS/BLUE SHIELD | Admitting: Physician Assistant

## 2018-04-07 ENCOUNTER — Encounter: Payer: Self-pay | Admitting: Physician Assistant

## 2018-04-07 VITALS — BP 125/85 | HR 76 | Temp 98.2°F | Wt 163.0 lb

## 2018-04-07 DIAGNOSIS — Z789 Other specified health status: Secondary | ICD-10-CM

## 2018-04-07 DIAGNOSIS — R1032 Left lower quadrant pain: Secondary | ICD-10-CM

## 2018-04-07 DIAGNOSIS — K52839 Microscopic colitis, unspecified: Secondary | ICD-10-CM

## 2018-04-07 DIAGNOSIS — A09 Infectious gastroenteritis and colitis, unspecified: Secondary | ICD-10-CM

## 2018-04-07 MED ORDER — METRONIDAZOLE 500 MG PO TABS: 500.0000 mg | ORAL_TABLET | Freq: Three times a day (TID) | ORAL | 0 refills | Status: DC

## 2018-04-07 MED ORDER — CEFTRIAXONE SODIUM 1 G IJ SOLR
1.0000 g | Freq: Once | INTRAMUSCULAR | Status: AC
  Administered 2018-04-07: 1 g via INTRAMUSCULAR

## 2018-04-07 MED ORDER — ONDANSETRON 4 MG PO TBDP
4.0000 mg | ORAL_TABLET | Freq: Three times a day (TID) | ORAL | 0 refills | Status: DC | PRN
Start: 2018-04-07 — End: 2018-09-30

## 2018-04-07 NOTE — Progress Notes (Signed)
HPI:                                                                Sara Hurst is a 47 y.o. female who presents to [facility] today for diarrhea  Pleasant 47 yo F with PMH of microscopic colitis and recent travel to [place] presents with 4 days of fever and loose stools.  Patient was seen by her PCP approximately 12 days ago for LLQ abdominal pain c/f colitis flare versus diverticulitis. She was treated conservatively with probiotic and dietary changes. Her CBC and CMP were unremarkable. She was given a prescription for Cipro/Flagyl to fill if symptoms worsened. She recently traveled to [place] for a mission trip, returned May 17th. She did not receive any recommended travel vaccines. She ate local food, mainly rice and chicken. Reports feeling constipated for most of the trip. On the evening she returned she spiked a fever of 101 and had a loose BM. She decided to start antibiotics. Reports she is now having 10 episodes of watery brown diarrhea and nausea. Tolerating PO. Denies hematochezia or melena. No additional fevers since starting antibiotic therapy. LLQ pain is unchanged.   Past Medical History:  Diagnosis Date  . Bipolar disorder (HCC)   . Colitis   . Hyperlipidemia    History reviewed. No pertinent surgical history. Social History   Tobacco Use  . Smoking status: Never Smoker  . Smokeless tobacco: Never Used  Substance Use Topics  . Alcohol use: Yes   family history includes Breast cancer in her maternal aunt; Cancer in her father and mother; Hyperlipidemia in her father.    ROS: Review of Systems  Constitutional: Positive for fever.  Gastrointestinal: Positive for abdominal pain, diarrhea and nausea. Negative for blood in stool, melena and vomiting.  All other systems reviewed and are negative.    Medications: Current Outpatient Medications  Medication Sig Dispense Refill  . ARIPiprazole (ABILIFY) 5 MG  tablet Take 1 tablet (5 mg total) by mouth daily. 90 tablet 3  . b complex vitamins tablet Take 1 tablet by mouth daily.    Marland Kitchen BIOTIN PO Take by mouth.    . ciprofloxacin (CIPRO) 500 MG tablet Take 1 tablet (500 mg total) by mouth 2 (two) times daily. For 10 days. 20 tablet 0  . cyclobenzaprine (FLEXERIL) 10 MG tablet Take 1 tablet (10 mg total) by mouth 3 (three) times daily as needed for muscle spasms. 30 tablet 0  . JUNEL FE 1/20 1-20 MG-MCG tablet Take 1 tablet by mouth daily. 1 Package 11  . Omega-3 Fatty Acids (FISH OIL PO) Take by mouth.    . metroNIDAZOLE (FLAGYL) 500 MG tablet Take 1 tablet (500 mg total) by mouth 3 (three) times daily. For 10 days. 30 tablet 0  . ondansetron (ZOFRAN-ODT) 4 MG disintegrating tablet Take 1 tablet (4 mg total) by mouth every 8 (eight) hours as needed for nausea or vomiting. 16 tablet 0   No current facility-administered medications for this visit.    No Known Allergies     Objective:  BP 125/85   Pulse 76   Temp 98.2 F (36.8 C) (Oral)   Wt 163 lb (73.9 kg)   BMI 29.81 kg/m  Gen:  alert, not ill-appearing, no distress, appropriate for age HEENT: head normocephalic without obvious abnormality, conjunctiva and cornea clear, oropharynx clear, moist mucous membranes, trachea midline Pulm: Normal work of breathing, normal phonation, clear to auscultation bilaterally, no wheezes, rales or rhonchi CV: Normal rate, regular rhythm, s1 and s2 distinct, no murmurs, clicks or rubs  GI: abdomen soft, diffusely tender worse in the LLQ, no rebound, rigidity or guarding Neuro: alert and oriented x 3, no tremor MSK: extremities atraumatic, normal gait and station Skin: intact, no rashes on exposed skin, no jaundice, no cyanosis Psych: well-groomed, cooperative, good eye contact, euthymic mood, affect mood-congruent, speech is articulate, and thought processes clear and goal-directed    No results found for this or any previous visit (from the past 72  hour(s)). No results found.    Assessment and Plan: 47 y.o. female with   Infectious gastroenteritis and colitis, unspecified - Plan: CBC with Differential/Platelet, Comprehensive metabolic panel, Lipase, metroNIDAZOLE (FLAGYL) 500 MG tablet, Hepatitis A antibody, IgM, ondansetron (ZOFRAN-ODT) 4 MG disintegrating tablet, cefTRIAXone (ROCEPHIN) injection 1 g  Left lower quadrant pain - Plan: CBC with Differential/Platelet, Comprehensive metabolic panel, Lipase, metroNIDAZOLE (FLAGYL) 500 MG tablet  Microscopic colitis, unspecified microscopic colitis type  Recent foreign travel - Plan: Hepatitis A antibody, IgM  - vitals stable, afebrile, no tachycardia - presumed infectious diarrhea given fever and recent travel. Discussed option to test stool, patient declined due to cost - checking CBC, CMP, Hep A IGM - cont Cipro 500 mg bid and Flagyl 500 mg tid for full 10 days. Rocephin 1 g IM given today to cover for any pathogens resistant to Cipro - supportive care with antiemetics and PO hydration    Patient education and anticipatory guidance given Patient agrees with treatment plan Follow-up in 4 days or sooner as needed if symptoms worsen or fail to improve  [name] PA-C

## 2018-04-07 NOTE — Patient Instructions (Addendum)
- continue your Ciprofloxacin and Metronidazole - make sure you are getting at least 1.5 -2 liters of fluid per day - dissolve 1 zofran under your tongue every 8 hours as needed for nausea - relative bowel rest - clear liquids or very bland diet for the next 48 hours or until diarrhea improves    Food Choices to Help Relieve Diarrhea, Adult When you have diarrhea, the foods you eat and your eating habits are very important. Choosing the right foods and drinks can help:  Relieve diarrhea.  Replace lost fluids and nutrients.  Prevent dehydration.  What general guidelines should I follow? Relieving diarrhea  Choose foods with less than 2 g or .07 oz. of fiber per serving.  Limit fats to less than 8 tsp (38 g or 1.34 oz.) a day.  Avoid the following: ? Foods and beverages sweetened with high-fructose corn syrup, honey, or sugar alcohols such as xylitol, sorbitol, and mannitol. ? Foods that contain a lot of fat or sugar. ? Fried, greasy, or spicy foods. ? High-fiber grains, breads, and cereals. ? Raw fruits and vegetables.  Eat foods that are rich in probiotics. These foods include dairy products such as yogurt and fermented milk products. They help increase healthy bacteria in the stomach and intestines (gastrointestinal tract, or GI tract).  If you have lactose intolerance, avoid dairy products. These may make your diarrhea worse.  Take medicine to help stop diarrhea (antidiarrheal medicine) only as told by your health care provider. Replacing nutrients  Eat small meals or snacks every 3-4 hours.  Eat bland foods, such as white rice, toast, or baked potato, until your diarrhea starts to get better. Gradually reintroduce nutrient-rich foods as tolerated or as told by your health care provider. This includes: ? Well-cooked protein foods. ? Peeled, seeded, and soft-cooked fruits and vegetables. ? Low-fat dairy products.  Take vitamin and mineral supplements as told by your  health care provider. Preventing dehydration   Start by sipping water or a special solution to prevent dehydration (oral rehydration solution, ORS). Urine that is clear or pale yellow means that you are getting enough fluid.  Try to drink at least 8-10 cups of fluid each day to help replace lost fluids.  You may add other liquids in addition to water, such as clear juice or decaffeinated sports drinks, as tolerated or as told by your health care provider.  Avoid drinks with caffeine, such as coffee, tea, or soft drinks.  Avoid alcohol. What foods are recommended? The items listed may not be a complete list. Talk with your health care provider about what dietary choices are best for you. Grains White rice. White, [place], or pita breads (fresh or toasted), including plain rolls, buns, or bagels. White pasta. Saltine, soda, or graham crackers. Pretzels. Low-fiber cereal. Cooked cereals made with water (such as cornmeal, farina, or cream cereals). Plain muffins. Matzo. Melba toast. Zwieback. Vegetables Potatoes (without the skin). Most well-cooked and canned vegetables without skins or seeds. Tender lettuce. Fruits Apple sauce. Fruits canned in juice. Cooked apricots, cherries, grapefruit, peaches, pears, or plums. Fresh bananas and cantaloupe. Meats and other protein foods Baked or boiled chicken. Eggs. Tofu. Fish. Seafood. Smooth nut butters. Ground or well-cooked tender beef, ham, veal, lamb, pork, or poultry. Dairy Plain yogurt, kefir, and unsweetened liquid yogurt. Lactose-free milk, buttermilk, skim milk, or soy milk. Low-fat or nonfat hard cheese. Beverages Water. Low-calorie sports drinks. Fruit juices without pulp. Strained tomato and vegetable juices. Decaffeinated teas. Sugar-free beverages not sweetened  with sugar alcohols. Oral rehydration solutions, if approved by your health care provider. Seasoning and other foods Bouillon, broth, or soups made from recommended foods. What  foods are not recommended? The items listed may not be a complete list. Talk with your health care provider about what dietary choices are best for you. Grains Whole grain, whole wheat, bran, or rye breads, rolls, pastas, and crackers. Wild or brown rice. Whole grain or bran cereals. Barley. Oats and oatmeal. Corn tortillas or taco shells. Granola. Popcorn. Vegetables Raw vegetables. Fried vegetables. Cabbage, broccoli, Brussels sprouts, artichokes, baked beans, beet greens, corn, kale, legumes, peas, sweet potatoes, and yams. Potato skins. Cooked spinach and cabbage. Fruits Dried fruit, including raisins and dates. Raw fruits. Stewed or dried prunes. Canned fruits with syrup. Meat and other protein foods Fried or fatty meats. Deli meats. Chunky nut butters. Nuts and seeds. Beans and lentils. Tomasa Blase. Hot dogs. Sausage. Dairy High-fat cheeses. Whole milk, chocolate milk, and beverages made with milk, such as milk shakes. Half-and-half. Cream. sour cream. Ice cream. Beverages Caffeinated beverages (such as coffee, tea, soda, or energy drinks). Alcoholic beverages. Fruit juices with pulp. Prune juice. Soft drinks sweetened with high-fructose corn syrup or sugar alcohols. High-calorie sports drinks. Fats and oils Butter. Cream sauces. Margarine. Salad oils. Plain salad dressings. Olives. Avocados. Mayonnaise. Sweets and desserts Sweet rolls, doughnuts, and sweet breads. Sugar-free desserts sweetened with sugar alcohols such as xylitol and sorbitol. Seasoning and other foods Honey. Hot sauce. Chili powder. Gravy. Cream-based or milk-based soups. Pancakes and waffles. Summary  When you have diarrhea, the foods you eat and your eating habits are very important.  Make sure you get at least 8-10 cups of fluid each day, or enough to keep your urine clear or pale yellow.  Eat bland foods and gradually reintroduce healthy, nutrient-rich foods as tolerated, or as told by your health care  provider.  Avoid high-fiber, fried, greasy, or spicy foods. This information is not intended to replace advice given to you by your health care provider. Make sure you discuss any questions you have with your health care provider. Document Released: 01/26/2004 Document Revised: 11/02/2016 Document Reviewed: 11/02/2016 Elsevier Interactive Patient Education  [employer].

## 2018-04-08 LAB — COMPREHENSIVE METABOLIC PANEL
AG RATIO: 1.9 (calc) (ref 1.0–2.5)
ALKALINE PHOSPHATASE (APISO): 65 U/L (ref 33–115)
ALT: 51 U/L — ABNORMAL HIGH (ref 6–29)
AST: 41 U/L — AB (ref 10–35)
Albumin: 4.4 g/dL (ref 3.6–5.1)
BUN: 8 mg/dL (ref 7–25)
CALCIUM: 9.5 mg/dL (ref 8.6–10.2)
CHLORIDE: 103 mmol/L (ref 98–110)
CO2: 29 mmol/L (ref 20–32)
Creat: 0.8 mg/dL (ref 0.50–1.10)
GLOBULIN: 2.3 g/dL (ref 1.9–3.7)
Glucose, Bld: 99 mg/dL (ref 65–99)
Potassium: 3.9 mmol/L (ref 3.5–5.3)
Sodium: 140 mmol/L (ref 135–146)
Total Bilirubin: 0.4 mg/dL (ref 0.2–1.2)
Total Protein: 6.7 g/dL (ref 6.1–8.1)

## 2018-04-08 LAB — CBC WITH DIFFERENTIAL/PLATELET
Basophils Absolute: 40 cells/uL (ref 0–200)
Basophils Relative: 0.6 %
EOS ABS: 152 {cells}/uL (ref 15–500)
Eosinophils Relative: 2.3 %
HEMATOCRIT: 41.4 % (ref 35.0–45.0)
HEMOGLOBIN: 14.2 g/dL (ref 11.7–15.5)
LYMPHS ABS: 1894 {cells}/uL (ref 850–3900)
MCH: 29.9 pg (ref 27.0–33.0)
MCHC: 34.3 g/dL (ref 32.0–36.0)
MCV: 87.2 fL (ref 80.0–100.0)
MPV: 11.2 fL (ref 7.5–12.5)
Monocytes Relative: 7.6 %
NEUTROS ABS: 4013 {cells}/uL (ref 1500–7800)
NEUTROS PCT: 60.8 %
Platelets: 196 10*3/uL (ref 140–400)
RBC: 4.75 10*6/uL (ref 3.80–5.10)
RDW: 12.7 % (ref 11.0–15.0)
Total Lymphocyte: 28.7 %
WBC mixed population: 502 cells/uL (ref 200–950)
WBC: 6.6 10*3/uL (ref 3.8–10.8)

## 2018-04-08 LAB — HEPATITIS A ANTIBODY, IGM: HEP A IGM: NONREACTIVE

## 2018-04-08 LAB — LIPASE: LIPASE: 14 U/L (ref 7–60)

## 2018-04-10 ENCOUNTER — Encounter: Payer: Self-pay | Admitting: Physician Assistant

## 2018-04-10 NOTE — Progress Notes (Signed)
[facility],  Your hepatitis A antibody was negative.  There is a slight increase in your liver enzymes, which can occur with any viral infection. Everything else looked great. No elevation in your white blood cells, which decreases likelihood of a bacterial infection. No electrolyte abnormalities or kidney injury.  Treatment plan is the same.  Best, Vinetta Bergamo

## 2018-04-11 ENCOUNTER — Ambulatory Visit (INDEPENDENT_AMBULATORY_CARE_PROVIDER_SITE_OTHER): Payer: BLUE CROSS/BLUE SHIELD | Admitting: Physician Assistant

## 2018-04-11 ENCOUNTER — Encounter: Payer: Self-pay | Admitting: Internal Medicine

## 2018-04-11 ENCOUNTER — Encounter: Payer: Self-pay | Admitting: Physician Assistant

## 2018-04-11 VITALS — BP 123/72 | HR 87 | Temp 97.8°F | Ht 62.01 in | Wt 159.0 lb

## 2018-04-11 DIAGNOSIS — K52839 Microscopic colitis, unspecified: Secondary | ICD-10-CM | POA: Diagnosis not present

## 2018-04-11 DIAGNOSIS — K529 Noninfective gastroenteritis and colitis, unspecified: Secondary | ICD-10-CM

## 2018-04-11 DIAGNOSIS — R740 Nonspecific elevation of levels of transaminase and lactic acid dehydrogenase [LDH]: Secondary | ICD-10-CM | POA: Diagnosis not present

## 2018-04-11 DIAGNOSIS — R7401 Elevation of levels of liver transaminase levels: Secondary | ICD-10-CM | POA: Insufficient documentation

## 2018-04-11 NOTE — Patient Instructions (Signed)
Colitis Colitis is inflammation of the colon. Colitis may last a short time (acute) or it may last a long time (chronic). What are the causes? This condition may be caused by:  Viruses.  Bacteria.  Reactions to medicine.  Certain autoimmune diseases, such as Crohn disease or ulcerative colitis.  What are the signs or symptoms? Symptoms of this condition include:  Diarrhea.  Passing bloody or tarry stool.  Pain.  Fever.  Vomiting.  Tiredness (fatigue).  Weight loss.  Bloating.  Sudden increase in abdominal pain.  Having fewer bowel movements than usual.  How is this diagnosed? This condition is diagnosed with a stool test or a blood test. You may also have other tests, including X-rays, a CT scan, or a colonoscopy. How is this treated? Treatment may include:  Resting the bowel. This involves not eating or drinking for a period of time.  Fluids that are given through an IV tube.  Medicine for pain and diarrhea.  Antibiotic medicines.  Cortisone medicines.  Surgery.  Follow these instructions at home: Eating and drinking  Follow instructions from your health care provider about eating or drinking restrictions.  Drink enough fluid to keep your urine clear or pale yellow.  Work with a dietitian to determine which foods cause your condition to flare up.  Avoid foods that cause flare-ups.  Eat a well-balanced diet. Medicines  Take over-the-counter and prescription medicines only as told by your health care provider.  If you were prescribed an antibiotic medicine, take it as told by your health care provider. Do not stop taking the antibiotic even if you start to feel better. General instructions  Keep all follow-up visits as told by your health care provider. This is important. Contact a health care provider if:  Your symptoms do not go away.  You develop new symptoms. Get help right away if:  You have a fever that does not go away with  treatment.  You develop chills.  You have extreme weakness, fainting, or dehydration.  You have repeated vomiting.  You develop severe pain in your abdomen.  You pass bloody or tarry stool. This information is not intended to replace advice given to you by your health care provider. Make sure you discuss any questions you have with your health care provider. Document Released: 12/13/2004 Document Revised: 04/12/2016 Document Reviewed: 02/28/2015 Elsevier Interactive Patient Education  2018 [employer].  Low-Fiber Diet Fiber is found in fruits, vegetables, and whole grains. A low-fiber diet restricts fibrous foods that are not digested in the small intestine. A diet containing about 10-15 grams of fiber per day is considered low fiber. Low-fiber diets may be used to:  Promote healing and rest the bowel during intestinal flare-ups.  Prevent blockage of a partially obstructed or narrowed gastrointestinal tract.  Reduce fecal weight and volume.  Slow the movement of feces.  You may be on a low-fiber diet as a transitional diet following surgery, after an injury (trauma), or because of a short (acute) or lifelong (chronic) illness. Your health care provider will determine the length of time you need to stay on this diet. What do I need to know about a low-fiber diet? Always check the fiber content on the packaging's Nutrition Facts label, especially on foods from the grains list. Ask your dietitian if you have questions about specific foods that are related to your condition, especially if the food is not listed below. In general, a low-fiber food will have less than 2 g of fiber. What  foods can I eat? Grains All breads and crackers made with white flour. Sweet rolls, doughnuts, waffles, pancakes, [place] toast, bagels. Pretzels, Melba toast, zwieback. Well-cooked cereals, such as cornmeal, farina, or cream cereals. Dry cereals that do not contain whole grains, fruit, or nuts, such as  refined corn, wheat, rice, and oat cereals. Potatoes prepared any way without skins, plain pastas and noodles, refined white rice. Use white flour for baking and making sauces. Use allowed list of grains for casseroles, dumplings, and puddings. Vegetables Strained tomato and vegetable juices. Fresh lettuce, cucumber, spinach. Well-cooked (no skin or pulp) or canned vegetables, such as asparagus, bean sprouts, beets, carrots, green beans, mushrooms, potatoes, pumpkin, spinach, yellow squash, tomato sauce/puree, turnips, yams, and zucchini. Keep servings limited to  cup. Fruits All fruit juices except prune juice. Cooked or canned fruits without skin and seeds, such as applesauce, apricots, cherries, fruit cocktail, grapefruit, grapes, mandarin oranges, melons, peaches, pears, pineapple, and plums. Fresh fruits without skin, such as apricots, avocados, bananas, melons, pineapple, nectarines, and peaches. Keep servings limited to  cup or 1 piece. Meat and Other Protein Sources Ground or well-cooked tender beef, ham, veal, lamb, pork, or poultry. Eggs, plain cheese. Fish, oysters, shrimp, lobster, and other seafood. Liver, organ meats. Smooth nut butters. Dairy All milk products and alternative dairy substitutes, such as soy, rice, almond, and coconut, not containing added whole nuts, seeds, or added fruit. Beverages Decaf coffee, fruit, and vegetable juices or smoothies (small amounts, with no pulp or skins, and with fruits from allowed list), sports drinks, herbal tea. Condiments Ketchup, mustard, vinegar, cream sauce, cheese sauce, cocoa powder. Spices in moderation, such as allspice, basil, bay leaves, celery powder or leaves, cinnamon, cumin powder, curry powder, ginger, mace, marjoram, onion or garlic powder, oregano, paprika, parsley flakes, ground pepper, rosemary, sage, savory, tarragon, thyme, and turmeric. Sweets and Desserts Plain cakes and cookies, pie made with allowed fruit, pudding,  custard, cream pie. Gelatin, fruit, ice, sherbet, frozen ice pops. Ice cream, ice milk without nuts. Plain hard candy, honey, jelly, molasses, syrup, sugar, chocolate syrup, gumdrops, marshmallows. Limit overall sugar intake. Fats and Oil Margarine, butter, cream, mayonnaise, salad oils, plain salad dressings made from allowed foods. Choose healthy fats such as olive oil, canola oil, and omega-3 fatty acids (such as found in salmon or tuna) when possible. Other Bouillon, broth, or cream soups made from allowed foods. Any strained soup. Casseroles or mixed dishes made with allowed foods. The items listed above may not be a complete list of recommended foods or beverages. Contact your dietitian for more options. What foods are not recommended? Grains All whole wheat and whole grain breads and crackers. Multigrains, rye, bran seeds, nuts, or coconut. Cereals containing whole grains, multigrains, bran, coconut, nuts, raisins. Cooked or dry oatmeal, steel-cut oats. Coarse wheat cereals, granola. Cereals advertised as high fiber. Potato skins. Whole grain pasta, wild or brown rice. Popcorn. Coconut flour. Bran, buckwheat, corn bread, multigrains, rye, wheat germ. Vegetables Fresh, cooked or canned vegetables, such as artichokes, asparagus, beet greens, broccoli, Brussels sprouts, cabbage, celery, cauliflower, corn, eggplant, kale, legumes or beans, okra, peas, and tomatoes. Avoid large servings of any vegetables, especially raw vegetables. Fruits Fresh fruits, such as apples with or without skin, berries, cherries, figs, grapes, grapefruit, guavas, kiwis, mangoes, oranges, papayas, pears, persimmons, pineapple, and pomegranate. Prune juice and juices with pulp, stewed or dried prunes. Dried fruits, dates, raisins. Fruit seeds or skins. Avoid large servings of all fresh fruits. Meats and Other Protein Sources  Tough, fibrous meats with gristle. Chunky nut butter. Cheese made with seeds, nuts, or other foods  not recommended. Nuts, seeds, legumes (beans, including baked beans), dried peas, beans, lentils. Dairy Yogurt or cheese that contains nuts, seeds, or added fruit. Beverages Fruit juices with high pulp, prune juice. Caffeinated coffee and teas. Condiments Coconut, maple syrup, pickles, olives. Sweets and Desserts Desserts, cookies, or candies that contain nuts or coconut, chunky peanut butter, dried fruits. Jams, preserves with seeds, marmalade. Large amounts of sugar and sweets. Any other dessert made with fruits from the not recommended list. Other Soups made from vegetables that are not recommended or that contain other foods not recommended. The items listed above may not be a complete list of foods and beverages to avoid. Contact your dietitian for more information. This information is not intended to replace advice given to you by your health care provider. Make sure you discuss any questions you have with your health care provider. Document Released: 04/27/2002 Document Revised: 04/12/2016 Document Reviewed: 09/28/2013 Elsevier Interactive Patient Education  2017 [employer].

## 2018-04-11 NOTE — Progress Notes (Signed)
HPI:                                                                Sara Hurst is a 47 y.o. female who presents to [facility]: [facility] today for diarrhea   Interval history: She is on day 6 of Cipro/Flagyl. Received single dose of IM Rocephin 1g in office 4 days ago Labs showed mild transaminitis, normal CBC, negative Hep A Diarrhea has improved 2-3 episodes, brown, loose stools per day. She is tolerating PO fluids and bland diet Still having LLQ pain, constant, dull and occasionally sharp. Worse with crossing legs or bending. Temperatures at home 99's Decreased appetite, increased bloating and gas Denies nausea/vomiting, hematochezia, melena, tenesmus    Pleasant 47 yo F with PMH of microscopic colitis and recent travel to [place] presents with 4 days of fever and loose stools.  Patient was seen by her PCP approximately 12 days ago for LLQ abdominal pain c/f colitis flare versus diverticulitis. She was treated conservatively with probiotic and dietary changes. Her CBC and CMP were unremarkable. She was given a prescription for Cipro/Flagyl to fill if symptoms worsened. She recently traveled to [place] for a mission trip, returned May 17th. She did not receive any recommended travel vaccines. She ate local food, mainly rice and chicken. Reports feeling constipated for most of the trip. On the evening she returned she spiked a fever of 101 and had a loose BM. She decided to start antibiotics. Reports she is now having 10 episodes of watery brown diarrhea and nausea. Tolerating PO. Denies hematochezia or melena. No additional fevers since starting antibiotic therapy. LLQ pain is unchanged.   Past Medical History:  Diagnosis Date  . Bipolar disorder (HCC)   . Colitis   . Hyperlipidemia    History reviewed. No pertinent surgical history. Social History   Tobacco Use  . Smoking status: Never Smoker  . Smokeless tobacco: Never Used   Substance Use Topics  . Alcohol use: Yes   family history includes Breast cancer in her maternal aunt; Cancer in her father and mother; Hyperlipidemia in her father.    ROS: Review of Systems  Constitutional: Negative for fever.  Gastrointestinal: Positive for abdominal pain and diarrhea. Negative for blood in stool, melena, nausea and vomiting.  Neurological: Negative for dizziness and loss of consciousness.  All other systems reviewed and are negative.    Medications: Current Outpatient Medications  Medication Sig Dispense Refill  . ARIPiprazole (ABILIFY) 5 MG tablet Take 1 tablet (5 mg total) by mouth daily. 90 tablet 3  . b complex vitamins tablet Take 1 tablet by mouth daily.    Marland Kitchen BIOTIN PO Take by mouth.    . ciprofloxacin (CIPRO) 500 MG tablet Take 1 tablet (500 mg total) by mouth 2 (two) times daily. For 10 days. 20 tablet 0  . cyclobenzaprine (FLEXERIL) 10 MG tablet Take 1 tablet (10 mg total) by mouth 3 (three) times daily as needed for muscle spasms. 30 tablet 0  . JUNEL FE 1/20 1-20 MG-MCG tablet Take 1 tablet by mouth daily. 1 Package 11  . metroNIDAZOLE (FLAGYL) 500 MG tablet Take 1 tablet (500 mg total) by mouth 3 (three) times daily. For 10 days. 30 tablet 0  .  Omega-3 Fatty Acids (FISH OIL PO) Take by mouth.    . ondansetron (ZOFRAN-ODT) 4 MG disintegrating tablet Take 1 tablet (4 mg total) by mouth every 8 (eight) hours as needed for nausea or vomiting. 16 tablet 0   No current facility-administered medications for this visit.    No Known Allergies     Objective:  BP 123/72   Pulse 87   Temp 97.8 F (36.6 C)   Ht 5' 2.01" (1.575 m)   Wt 159 lb (72.1 kg)   SpO2 99%   BMI 29.07 kg/m  Gen:  alert, not ill-appearing, no distress, appropriate for age HEENT: head normocephalic without obvious abnormality, conjunctiva and cornea clear, oropharynx clear, moist mucous membranes, trachea midline Pulm: Normal work of breathing, normal phonation GI: bowel  sounds hyperactive, abdomen soft, diffuse lower abdominal pain worse in the LLQ, no rebound, rigidity or guarding Neuro: alert and oriented x 3, no tremor MSK: extremities atraumatic, normal gait and station Skin: intact, no rashes on exposed skin, no jaundice, no cyanosis Psych: well-groomed, cooperative, good eye contact, euthymic mood, affect mood-congruent, speech is articulate, and thought processes clear and goal-directed  Lab Results  Component Value Date   ALT 51 (H) 04/07/2018   AST 41 (H) 04/07/2018   ALKPHOS 48 03/15/2017   BILITOT 0.4 04/07/2018   Lab Results  Component Value Date   CREATININE 0.80 04/07/2018   BUN 8 04/07/2018   NA 140 04/07/2018   K 3.9 04/07/2018   CL 103 04/07/2018   CO2 29 04/07/2018   Lab Results  Component Value Date   WBC 6.6 04/07/2018   HGB 14.2 04/07/2018   HCT 41.4 04/07/2018   MCV 87.2 04/07/2018   PLT 196 04/07/2018     No results found for this or any previous visit (from the past 72 hour(s)). No results found.    Assessment and Plan: 47 y.o. female with   Gastroenteritis, infectious, presumed - Plan: Ova and parasite examination, Stool culture, C. difficile GDH and Toxin A/B, Giardia antigen  Transaminitis  Microscopic colitis, unspecified microscopic colitis type - Plan: Ambulatory referral to Gastroenterology  - vitals stable, afebrile, no tachycardia, abdomen is not acute - unremarkable CBC, negative HepA - mild transaminitis, no electrolyte disturbance of AKI - will proceed with stool studies. Declines CT Abd/Pelvis today - cont Cipro / Flagyl  - cont low-fiber diet and pushing PO fluids. Add probiotics - referring to GI due to history of microscopic colitis. Symptoms began before recent travel to [place], so possible this is a colitis flare  Patient education and anticipatory guidance given Patient agrees with treatment plan Follow-up as needed if symptoms worsen or fail to improve  Sara Hubert  PA-C

## 2018-04-15 ENCOUNTER — Telehealth: Payer: Self-pay

## 2018-04-15 DIAGNOSIS — K52839 Microscopic colitis, unspecified: Secondary | ICD-10-CM

## 2018-04-15 NOTE — Telephone Encounter (Signed)
Referral placed to [facility] per patient preference

## 2018-04-15 NOTE — Telephone Encounter (Signed)
Pt reports that the GI she was referred to last week can't schedule her until 06/06/18.  She is wanting to be seen sooner and would like for her referral to go to [place]. -EH/RMA

## 2018-04-19 LAB — STOOL CULTURE
MICRO NUMBER: 90642878
MICRO NUMBER: 90642879
MICRO NUMBER:: 90642880
SHIGA RESULT: NOT DETECTED
SPECIMEN QUALITY: ADEQUATE
SPECIMEN QUALITY:: ADEQUATE
SPECIMEN QUALITY:: ADEQUATE

## 2018-04-19 LAB — OVA AND PARASITE EXAMINATION
CONCENTRATE RESULT:: NONE SEEN
SPECIMEN QUALITY: ADEQUATE
TRICHROME RESULT:: NONE SEEN
VKL: 90642600

## 2018-04-19 LAB — C. DIFFICILE GDH AND TOXIN A/B
GDH ANTIGEN: NOT DETECTED
MICRO NUMBER:: 90642339
SPECIMEN QUALITY:: ADEQUATE
TOXIN A AND B: NOT DETECTED

## 2018-04-19 LAB — GIARDIA ANTIGEN
MICRO NUMBER:: 90642884
RESULT:: NOT DETECTED
SPECIMEN QUALITY:: ADEQUATE

## 2018-06-06 ENCOUNTER — Ambulatory Visit: Payer: BLUE CROSS/BLUE SHIELD | Admitting: Internal Medicine

## 2018-08-12 ENCOUNTER — Ambulatory Visit: Payer: BLUE CROSS/BLUE SHIELD | Admitting: Physician Assistant

## 2018-08-12 ENCOUNTER — Encounter: Payer: Self-pay | Admitting: Physician Assistant

## 2018-08-12 VITALS — BP 117/75 | HR 72 | Temp 98.1°F | Ht 62.01 in | Wt 167.0 lb

## 2018-08-12 DIAGNOSIS — R300 Dysuria: Secondary | ICD-10-CM

## 2018-08-12 DIAGNOSIS — R350 Frequency of micturition: Secondary | ICD-10-CM

## 2018-08-12 LAB — POCT URINALYSIS DIPSTICK
BILIRUBIN UA: NEGATIVE
Blood, UA: NEGATIVE
GLUCOSE UA: NEGATIVE
Ketones, UA: NEGATIVE
Leukocytes, UA: NEGATIVE
Nitrite, UA: NEGATIVE
Protein, UA: NEGATIVE
Spec Grav, UA: 1.01 (ref 1.010–1.025)
Urobilinogen, UA: 0.2 E.U./dL
pH, UA: 6.5 (ref 5.0–8.0)

## 2018-08-12 MED ORDER — NITROFURANTOIN MONOHYD MACRO 100 MG PO CAPS: 100.0000 mg | ORAL_CAPSULE | Freq: Two times a day (BID) | ORAL | 0 refills | Status: DC

## 2018-08-12 NOTE — Patient Instructions (Signed)

## 2018-08-12 NOTE — Progress Notes (Signed)
Subjective:     Patient ID: Sara Hurst, female   DOB: 1971-02-16, 47 y.o.   MRN: [id number]  HPI   Patient is a 47 yo female presenting today complaining of a possible UTI. She states that she noticed the symptoms yesterday. She has had UTIs before and says that this feels similar. She states that she has been having increased frequency with urination, continues to feel a pressure after voiding, and burning with urination. She denies any hematuria. She denies fever,chills, or N/V. She denies back pain or flank pain, but does complain of some lower abdominal/pelvic pain on both the right and left sides but states that this has been going on for several months. She has an appointment at her GYN for her yearly appointment and is going to address this with them. She reports taking some Advil, but has not taken anything  else for the UTI symptoms.  .. Active Ambulatory Problems    Diagnosis Date Noted  . Microscopic colitis 08/03/2016  . Hair loss 08/03/2016  . Bipolar 2 disorder, major depressive episode (HCC) 08/03/2016  . Multiple thyroid nodules 08/03/2016  . Hypertriglyceridemia 03/18/2017  . DDD (degenerative disc disease), cervical 07/08/2017  . Migraine with aura and without status migrainosus, not intractable 07/09/2017  . Itching 07/09/2017  . Twitching 07/09/2017  . Family history of MS (multiple sclerosis) 07/09/2017  . Neck pain 07/09/2017  . Vision changes 07/09/2017  . Papule 08/18/2017  . Breast mass, right 09/13/2017  . Birth control counseling 03/26/2018  . Left lower quadrant pain 03/26/2018  . Transaminitis 04/11/2018  . Gastroenteritis, infectious, presumed 04/11/2018   Resolved Ambulatory Problems    Diagnosis Date Noted  . No Resolved Ambulatory Problems   Past Medical History:  Diagnosis Date  . Bipolar disorder (HCC)   . Colitis   . Hyperlipidemia      Review of Systems  Constitutional: Negative for chills and fever.  Gastrointestinal: Negative for  nausea and vomiting.  Genitourinary: Positive for dysuria, frequency and pelvic pain. Negative for flank pain and hematuria.       Objective:   Physical Exam  Constitutional: She is oriented to person, place, and time. She appears well-developed and well-nourished.  HENT:  Head: Normocephalic and atraumatic.  Cardiovascular: Normal rate, regular rhythm and normal heart sounds.  Abdominal: Soft. Bowel sounds are normal. She exhibits no distension. There is tenderness in the right lower quadrant and left lower quadrant. There is no guarding and no CVA tenderness.  Neurological: She is alert and oriented to person, place, and time.  Psychiatric: She has a normal mood and affect. Her behavior is normal.       Assessment:     Marland Kitchen.Marland Kitchen.Diagnoses and all orders for this visit:  Urine frequency -     POCT Urinalysis Dipstick -     nitrofurantoin, macrocrystal-monohydrate, (MACROBID) 100 MG capsule; Take 1 capsule (100 mg total) by mouth 2 (two) times daily. For 7 days. -     Urine Culture  Dysuria -     POCT Urinalysis Dipstick -     nitrofurantoin, macrocrystal-monohydrate, (MACROBID) 100 MG capsule; Take 1 capsule (100 mg total) by mouth 2 (two) times daily. For 7 days. -     Urine Culture    Plan:     .Marland Kitchen. Results for orders placed or performed in visit on 08/12/18  POCT Urinalysis Dipstick  Result Value Ref Range   Color, UA yellow    Clarity, UA clear    Glucose,  UA Negative Negative   Bilirubin, UA negative    Ketones, UA negative    Spec Grav, UA 1.010 1.010 - 1.025   Blood, UA negative    pH, UA 6.5 5.0 - 8.0   Protein, UA Negative Negative   Urobilinogen, UA 0.2 0.2 or 1.0 E.U./dL   Nitrite, UA negative    Leukocytes, UA Negative Negative   Appearance     Odor     Discussed with patient dipstick looked normal. Will culture. Due to her symptoms started macrobid to treat empirically. HO given on symptomatic care.   Marland KitchenHarlon Flor PA-C, have reviewed and agree with  the above documentation in it's entirety.

## 2018-08-13 LAB — URINE CULTURE
MICRO NUMBER:: 91148484
SPECIMEN QUALITY:: ADEQUATE

## 2018-08-14 NOTE — Progress Notes (Signed)
Culture did not actually grow a significant amt of bacteria. It appears bacteria detected are normal colonizers. Symptoms could have been more inflammation. You should be able to stop antibiotic at this time.

## 2018-08-22 LAB — HM PAP SMEAR: HM PAP: NEGATIVE

## 2018-09-30 ENCOUNTER — Ambulatory Visit: Payer: Managed Care, Other (non HMO) | Admitting: Physician Assistant

## 2018-09-30 ENCOUNTER — Encounter: Payer: Self-pay | Admitting: Physician Assistant

## 2018-09-30 ENCOUNTER — Ambulatory Visit (INDEPENDENT_AMBULATORY_CARE_PROVIDER_SITE_OTHER): Payer: Managed Care, Other (non HMO)

## 2018-09-30 VITALS — BP 126/90 | HR 74 | Ht 62.01 in | Wt 167.0 lb

## 2018-09-30 DIAGNOSIS — M160 Bilateral primary osteoarthritis of hip: Secondary | ICD-10-CM | POA: Diagnosis not present

## 2018-09-30 DIAGNOSIS — E782 Mixed hyperlipidemia: Secondary | ICD-10-CM | POA: Diagnosis not present

## 2018-09-30 DIAGNOSIS — E781 Pure hyperglyceridemia: Secondary | ICD-10-CM | POA: Diagnosis not present

## 2018-09-30 DIAGNOSIS — M25552 Pain in left hip: Secondary | ICD-10-CM | POA: Diagnosis not present

## 2018-09-30 DIAGNOSIS — M25551 Pain in right hip: Secondary | ICD-10-CM | POA: Diagnosis not present

## 2018-09-30 DIAGNOSIS — E785 Hyperlipidemia, unspecified: Secondary | ICD-10-CM | POA: Insufficient documentation

## 2018-09-30 MED ORDER — ATORVASTATIN CALCIUM 20 MG PO TABS: 20.0000 mg | ORAL_TABLET | Freq: Every day | ORAL | 3 refills | Status: AC

## 2018-09-30 NOTE — Progress Notes (Signed)
Done

## 2018-09-30 NOTE — Patient Instructions (Signed)

## 2018-09-30 NOTE — Progress Notes (Signed)
Subjective:    Patient ID: Sara Hurst, female    DOB: 1970/12/10, 47 y.o.   MRN: [id number]  HPI  Patient is a 47 year old female with past medical history of migraines, microscopic colitis, bipolar 2 who presents to the clinic to follow-up on left lower quadrant/inguinal pain.  She recently went to her GYN and had a Pap smear and IUD placement.  A pelvic ultrasound was ordered with no abnormal findings.  She had a colonoscopy earlier this year that was normal.  She feels like her bowels and stools are within normal variant.  She has had this pain intermittently for over a year.  Certain things seem to trigger it.  Lately she has been waking up more at night with this pain.  Also walking seems to trigger it.  She does admit to exercising more.  At times she does take some ibuprofen and it seems to help.  She also had labs from [place].  It was mentioned that her cholesterol was elevated.  She like to address this issue. .. Family History  Problem Relation Age of Onset  . Cancer Mother   . Cancer Father   . Hyperlipidemia Father   . Breast cancer Maternal Aunt      Review of Systems    see HPI>  Objective:   Physical Exam  Constitutional: She is oriented to person, place, and time. She appears well-developed and well-nourished.  HENT:  Head: Normocephalic and atraumatic.  Cardiovascular: Normal rate and regular rhythm.  Pulmonary/Chest: Effort normal and breath sounds normal.  Musculoskeletal:  ROM at waist normal.  ROM bilateral hips normal with good internal and external ROM.  5/5 lower extremity strength.  No greater trochanter tenderness to palpation.  A little discomfort to palpation over left inguinal area to palpation and with flexion of left thigh.    Neurological: She is alert and oriented to person, place, and time.  Skin: No rash noted.  Psychiatric: She has a normal mood and affect. Her behavior is normal.          Assessment & Plan:  Marland KitchenMarland KitchenDiagnoses and  all orders for this visit:  Left hip pain -     DG HIPS BILAT WITH PELVIS 3-4 VIEWS -     Ambulatory referral to Physical Therapy  Hypertriglyceridemia -     atorvastatin (LIPITOR) 20 MG tablet; Take 1 tablet (20 mg total) by mouth daily.  Mixed hyperlipidemia -     atorvastatin (LIPITOR) 20 MG tablet; Take 1 tablet (20 mg total) by mouth daily.  Bilateral primary osteoarthritis of hip -     Ambulatory referral to Physical Therapy  Will fax order for labs and Pap to be sent from [facility] to abstract and.   Discussed the fact that seems like her cholesterol has remained elevated.  In May we did labs and said if there is no significant decreased due to her family history we would start a cholesterol-lowering medication.  Based off may labs her overall CV risk is 2.1% which is still low.  Since patient feels like there is no change with her dietary and exercise changes she would like to start a cholesterol medication.  Started Lipitor 20 mg once a day.  Discussed side effects we will recheck in 6 months.  Reassured that certainly a lot of work-up has been done surrounding this left inguinal/lower quadrant pain.  Feel like this could represent some hip arthritis.  We will start with x-rays today.  She certainly  could consider ibuprofen as needed for pain.  Anti-inflammatories are all standard treatment for arthritis.  I know there is concern with taking it daily.  For now just take as needed.  Bilateral hip arthritis and degenerative changes were confirmed on x-ray.  I think patient could benefit from physical therapy.  Physical therapy was ordered to focus on hip strength.

## 2018-09-30 NOTE — Progress Notes (Signed)
Call pt: xrays do show degenerative changes(arthritis) in both hips. I do think you would benefit from a round of PT. Are you ok with this?

## 2018-10-01 ENCOUNTER — Encounter: Payer: Self-pay | Admitting: Physician Assistant

## 2018-10-01 DIAGNOSIS — M25552 Pain in left hip: Secondary | ICD-10-CM | POA: Insufficient documentation

## 2018-10-01 DIAGNOSIS — M160 Bilateral primary osteoarthritis of hip: Secondary | ICD-10-CM | POA: Insufficient documentation

## 2018-10-10 ENCOUNTER — Encounter: Payer: Self-pay | Admitting: Rehabilitative and Restorative Service Providers"

## 2018-10-10 ENCOUNTER — Ambulatory Visit (INDEPENDENT_AMBULATORY_CARE_PROVIDER_SITE_OTHER): Payer: Managed Care, Other (non HMO) | Admitting: Rehabilitative and Restorative Service Providers"

## 2018-10-10 DIAGNOSIS — R29898 Other symptoms and signs involving the musculoskeletal system: Secondary | ICD-10-CM

## 2018-10-10 DIAGNOSIS — M25552 Pain in left hip: Secondary | ICD-10-CM

## 2018-10-10 DIAGNOSIS — M25551 Pain in right hip: Secondary | ICD-10-CM | POA: Diagnosis not present

## 2018-10-10 NOTE — Therapy (Addendum)
[facility] [place], [place], [address] Phone: [phone]   Fax:  [phone]  Physical Therapy Evaluation  Patient Details  Name: Sara Hurst MRN: [id number] Date of Birth: 07/02/1971 Referring Provider (PT): Iran Planas PA-C   Encounter Date: 10/10/2018  PT End of Session - 10/10/18 1045    Visit Number  1    Number of Visits  12    Date for PT Re-Evaluation  11/21/18    PT Start Time  0848    PT Stop Time  0950    PT Time Calculation (min)  62 min    Activity Tolerance  Patient tolerated treatment well       Past Medical History:  Diagnosis Date  . Bipolar disorder ([place])   . Colitis   . Hyperlipidemia     History reviewed. No pertinent surgical history.  There were no vitals filed for this visit.   Subjective Assessment - 10/10/18 0900    Subjective  Patient reports that she has been having pain in the Lt hip, occasionally in the Rt with no known injury. She has been through several diagnostic tests with negative results - diagnoses with degenerative changes of hip.     Pertinent History  DNC 2000    Patient Stated Goals  get rid of the hip pain     Currently in Pain?  Yes    Pain Score  2     Pain Location  Hip    Pain Orientation  Left;Anterior    Pain Descriptors / Indicators  Throbbing;Sharp;Nagging    Pain Type  Chronic pain    Pain Radiating Towards  anterior hip     Pain Onset  More than a month ago    Pain Frequency  Constant    Aggravating Factors   lying down to sleep; walking; sitting and moving into walking    Pain Relieving Factors  nothing          [facility] PT Assessment - 10/10/18 0001      Assessment   Medical Diagnosis  Lt hip pain     Referring Provider (PT)  Iran Planas PA-C    Onset Date/Surgical Date  11/19/17    Hand Dominance  Right    Next MD Visit  PRN    Prior Therapy  none      Precautions   Precautions  None      Balance Screen   Has the  patient fallen in the past 6 months  No    Has the patient had a decrease in activity level because of a fear of falling?   No    Is the patient reluctant to leave their home because of a fear of falling?   No      Prior Function   Level of Independence  Independent    Vocation  Full time employment    Vocation Requirements  legal assistandt - desk/computer     Leisure  household chores; elliptical 1.3 miles 3 days/wk; stationalry bike 5 miles/3 days/wk       Observation/Other Assessments   Focus on Therapeutic Outcomes (FOTO)   17% limitation       Sensation   Additional Comments  WNL's per pt report       Posture/Postural Control   Posture Comments  rounded posture; slightly flexed forward at hips       AROM   Overall AROM Comments  tight hips in extension and  IR     Lumbar Flexion  85%    Lumbar Extension  65% pulling anterior hips Lt > Rt     Lumbar - Right Side Bend  90% pulling     Lumbar - Left Side Bend  90% pulling     Lumbar - Right Rotation  90%    Lumbar - Left Rotation  90%      Strength   Overall Strength Comments  5/5 bilat LE's       Flexibility   Hamstrings  tight Lt     Quadriceps  tight bilat with pull into anterior hip/area of pain     ITB  WFL's bilat     Piriformis  tight bilat       Palpation   Spinal mobility  WNL's lumbar spine     Palpation comment  muscular tightness noted through bilat psoas and hip flexors into the quads       Special Tests   Other special tests  (+) tightness hip flexors with Marcello Moores test       Transfers   Comments  sits with ankles of legs crossed or tucking one leg under other in sitting       High Level Balance   High Level Balance Comments  10+ sec SLS bilat                 Objective measurements completed on examination: See above findings.      [facility] Adult PT Treatment/Exercise - 10/10/18 0001      Self-Care   Self-Care  --   to avoid sitting with ankle/knee/legs crossed      Therapeutic  Activites    Therapeutic Activities  --   myofacial ball relese work anterior hips standing      Knee/Hip Exercises: Stretches   Sports administrator  Right;Left;3 reps;30 seconds    Hip Flexor Stretch  Right;Left;2 reps;30 seconds   seated and repeated 2 reps supine knees to chest      Moist Heat Therapy   Number Minutes Moist Heat  20 Minutes    Moist Heat Location  Hip   bilat anterior  hips/lower abdomen      Electrical Stimulation   Electrical Stimulation Location  anterior hips - psoas/quads bialt     Electrical Stimulation Action  IFC    Electrical Stimulation Parameters  to tolerance    Electrical Stimulation Goals  Pain;Tone             PT Education - 10/10/18 [phone]    Education Details  HEP TENs     Person(s) Educated  Patient    Methods  Explanation;Demonstration;Tactile cues;Verbal cues;Handout    Comprehension  Verbalized understanding;Returned demonstration;Verbal cues required;Tactile cues required          PT Long Term Goals - 10/10/18 1051      PT LONG TERM GOAL #1   Title  Decreased pain with sleeping with patient reporting sleeping without awakening due to hip pain 11/21/17    Time  6    Period  Weeks    Status  New      PT LONG TERM GOAL #2   Title  Increase extensibility of muscle tissue through the anterior hips bilat 11/21/17    Time  6    Period  Weeks    Status  New      PT LONG TERM GOAL #3   Title  Patient reports no pain with functional activities moving sit to stand  or walking 11/21/17    Time  6    Period  Weeks    Status  New      PT LONG TERM GOAL #4   Title  Independent in HEP 11/21/17    Time  6    Period  Weeks    Status  New      PT LONG TERM GOAL #5   Title  Maintain to improved FOTO score 17% limitation 11/21/17    Time  6    Period  Weeks    Status  New             Plan - 10/10/18 1045    Clinical Impression Statement  Sara Hurst presents with ~ 11 month history of anterior hip pain bilateral hips Lt > Rt. She has had  numerous tests to r/o medical causes for pain and is now referred to PT with dx of pain with some mild degenerative changes in Lt hip on xray. Patient has persistent, intermittent pain in the Lt and Rt hips which is worse when she lies down to sleep and with moving from sitting to standing and taking a few steps.  She has slightly flexed forward posture and sits with poor posture and alignment. She sits at her job ~30 hours/wk. Patient has muscular shortening through the hip flexors as well as tightness and tenderness to palpation through the psoas and quads bilat. Patient will benefit form PT to address problems identified.     Clinical Presentation  Stable    Clinical Decision Making  Low    Rehab Potential  Good    PT Frequency  2x / week    PT Duration  6 weeks    PT Treatment/Interventions  Patient/family education;ADLs/Self Care Home Management;Cryotherapy;Electrical Stimulation;Iontophoresis 41m/ml Dexamethasone;Moist Heat;Ultrasound;Dry needling;Manual techniques;Neuromuscular re-education;Therapeutic activities;Therapeutic exercise    PT Next Visit Plan  review HEP; add 4 part core; add manual work through the hip flexors; progress to stretching and strengthening exercises as appropriate; modalities as indicated     Consulted and Agree with Plan of Care  Patient       Patient will benefit from skilled therapeutic intervention in order to improve the following deficits and impairments:  Postural dysfunction, Improper body mechanics, Pain, Increased fascial restricitons, Increased muscle spasms, Decreased mobility, Decreased range of motion, Decreased activity tolerance  Visit Diagnosis: Pain in left hip - Plan: PT plan of care cert/re-cert  Pain in right hip - Plan: PT plan of care cert/re-cert  Other symptoms and signs involving the musculoskeletal system - Plan: PT plan of care cert/re-cert     Problem List Patient Active Problem List   Diagnosis Date Noted  . Left hip pain  10/01/2018  . Bilateral primary osteoarthritis of hip 10/01/2018  . Hyperlipidemia 09/30/2018  . Transaminitis 04/11/2018  . Gastroenteritis, infectious, presumed 04/11/2018  . Birth control counseling 03/26/2018  . Left lower quadrant pain 03/26/2018  . Breast mass, right 09/13/2017  . Papule 08/18/2017  . Migraine with aura and without status migrainosus, not intractable 07/09/2017  . Itching 07/09/2017  . Twitching 07/09/2017  . Family history of MS (multiple sclerosis) 07/09/2017  . Neck pain 07/09/2017  . Vision changes 07/09/2017  . DDD (degenerative disc disease), cervical 07/08/2017  . Hypertriglyceridemia 03/18/2017  . Microscopic colitis 08/03/2016  . Hair loss 08/03/2016  . Bipolar 2 disorder, major depressive episode ([place] 08/03/2016  . Multiple thyroid nodules 08/03/2016    Sara Hurst PNilda SimmerPT, MPH  10/10/2018, 10:55 AM  [facility], [place], [address] Phone: [phone]   Fax:  [phone]  Name: Sara Hurst MRN: [id number] Date of Birth: 10-Nov-1971 PHYSICAL THERAPY DISCHARGE SUMMARY  Visits from Start of Care: Evaluation only   Current functional level related to goals / functional outcomes: See evaluation    Remaining deficits: Unknown    Education / Equipment: Initial HEP  Plan: Patient agrees to discharge.  Patient goals were not met. Patient is being discharged due to not returning since the last visit.  ?????     Sara Hurst P. Helene Kelp PT, MPH 11/26/18 12:58 PM

## 2018-10-10 NOTE — Patient Instructions (Signed)
Self massage using ~ 4 inch plastic ball        Quads / HF, Supine   Lie near edge of bed, pull both knees up toward chest. Hold one knee as you drop the other leg off the edge of the bed.  Relax hanging knee/can bend knee back if indicated. Hold 30 seconds. Repeat 3 times per session. Do 2-3 sessions per day.  Quads / HF, Prone KNEE: Quadriceps - Prone    Place strap around ankle. Bring ankle toward buttocks. Press hip into surface. Hold 30 seconds. Repeat 3 times per session. Do 2-3 sessions per day.   TENS UNIT: This is helpful for muscle pain and spasm.   Search and Purchase a TENS 7000 2nd edition at www.tenspros.com. It should be less than $30.     TENS unit instructions: Do not shower or bathe with the unit on Turn the unit off before removing electrodes or batteries If the electrodes lose stickiness add a drop of water to the electrodes after they are disconnected from the unit and place on plastic sheet. If you continued to have difficulty, call the TENS unit company to purchase more electrodes. Do not apply lotion on the skin area prior to use. Make sure the skin is clean and dry as this will help prolong the life of the electrodes. After use, always check skin for unusual red areas, rash or other skin difficulties. If there are any skin problems, does not apply electrodes to the same area. Never remove the electrodes from the unit by pulling the wires. Do not use the TENS unit or electrodes other than as directed. Do not change electrode placement without consultating your therapist or physician. Keep 2 fingers with between each electrode.

## 2018-10-20 LAB — CBC AND DIFFERENTIAL
HEMATOCRIT: 46 (ref 36–46)
Hemoglobin: 15.2 (ref 12.0–16.0)
PLATELETS: 243 (ref 150–399)

## 2018-10-20 LAB — HEPATIC FUNCTION PANEL
ALT: 32 (ref 7–35)
AST: 15 (ref 13–35)
Alkaline Phosphatase: 100 (ref 25–125)
Bilirubin, Total: 0.3

## 2018-10-20 LAB — BASIC METABOLIC PANEL
BUN: 10 (ref 4–21)
CREATININE: 0.8 (ref 0.5–1.1)
GLUCOSE: 112
Potassium: 4.3 (ref 3.4–5.3)
Sodium: 141 (ref 137–147)

## 2018-10-20 LAB — LIPID PANEL
Cholesterol: 251 — AB (ref 0–200)
HDL: 43 (ref 35–70)
LDL Cholesterol: 159
Triglycerides: 247 — AB (ref 40–160)

## 2018-10-24 ENCOUNTER — Encounter: Payer: Managed Care, Other (non HMO) | Admitting: Rehabilitative and Restorative Service Providers"

## 2018-10-31 ENCOUNTER — Encounter: Payer: Managed Care, Other (non HMO) | Admitting: Rehabilitative and Restorative Service Providers"

## 2018-11-04 ENCOUNTER — Encounter: Payer: Self-pay | Admitting: Physician Assistant

## 2018-11-04 ENCOUNTER — Encounter: Payer: Self-pay | Admitting: Family Medicine

## 2018-11-04 ENCOUNTER — Ambulatory Visit: Payer: Managed Care, Other (non HMO) | Admitting: Physician Assistant

## 2018-11-04 ENCOUNTER — Ambulatory Visit: Payer: Managed Care, Other (non HMO) | Admitting: Family Medicine

## 2018-11-04 VITALS — BP 132/79 | HR 80 | Ht 62.0 in | Wt 166.0 lb

## 2018-11-04 DIAGNOSIS — R109 Unspecified abdominal pain: Secondary | ICD-10-CM

## 2018-11-04 DIAGNOSIS — R300 Dysuria: Secondary | ICD-10-CM

## 2018-11-04 LAB — POCT URINALYSIS DIPSTICK
Bilirubin, UA: NEGATIVE
GLUCOSE UA: NEGATIVE
Ketones, UA: NEGATIVE
NITRITE UA: NEGATIVE
PROTEIN UA: NEGATIVE
SPEC GRAV UA: 1.02 (ref 1.010–1.025)
Urobilinogen, UA: 1 E.U./dL
pH, UA: 7 (ref 5.0–8.0)

## 2018-11-04 MED ORDER — TAMSULOSIN HCL 0.4 MG PO CAPS: 0.4000 mg | ORAL_CAPSULE | Freq: Every day | ORAL | 3 refills | Status: DC

## 2018-11-04 MED ORDER — CEFDINIR 300 MG PO CAPS: 300.0000 mg | ORAL_CAPSULE | Freq: Two times a day (BID) | ORAL | 0 refills | Status: DC

## 2018-11-04 NOTE — Progress Notes (Signed)
Sara Hurst is a 47 y.o. female who [facility] today for dysuria. Sara Hurst started having pressure in her lower abdominal area with urination yesterday, as well as dysuria, increased frequency and increased urgency. She was awoken from sleep last night with severe, sharp left-sided back pain that was radiating into her groin. This morning, she had blood in her urine. She was concerned about vaginal bleeding with her IUD; she checked the strings and they were in place. She also felt feverish this morning, so she took Tylenol.   She has had UTI in the past, but has never had back pain or bleeding as she does this time. She saw Tandy GawJade Breeback, [place], on 11/12 for lower abdominal pain. Lesly RubensteinJade reported that she had a normal pelvic ultrasound at that time. She had an IUD check on 12/12 that was normal.    ROS as above:  Exam:  BP 132/79   Pulse 80   Ht 5\' 2"  (1.575 m)   Wt 166 lb (75.3 kg)   BMI 30.36 kg/m  Wt Readings from Last 5 Encounters:  11/04/18 166 lb (75.3 kg)  09/30/18 167 lb (75.8 kg)  08/12/18 167 lb (75.8 kg)  04/11/18 159 lb (72.1 kg)  04/07/18 163 lb (73.9 kg)    Gen: Well NAD HEENT: EOMI,  MMM Lungs: Normal work of breathing. CTABL Heart: RRR no MRG Abd: NABS, Soft. Nondistended, Nontender, no CVA tenderness Exts: Brisk capillary refill, warm and well perfused.   Lab and Radiology Results Results for orders placed or performed in visit on 11/04/18 (from the past 72 hour(s))  POCT Urinalysis Dipstick     Status: Abnormal   Collection Time: 11/04/18  2:45 PM  Result Value Ref Range   Color, UA YELLOW    Clarity, UA CLEAR    Glucose, UA Negative Negative   Bilirubin, UA NEGATIVE    Ketones, UA NEGATIVE    Spec Grav, UA 1.020 1.010 - 1.025   Blood, UA TRACE    pH, UA 7.0 5.0 - 8.0   Protein, UA Negative Negative   Urobilinogen, UA  1.0 0.2 or 1.0 E.U./dL   Nitrite, UA NEGATIVE    Leukocytes, UA Trace (A) Negative   Appearance     Odor      Assessment and Plan: 47 y.o. female with  Dysuria and back pain: Since yesterday, Sara Hurst has had subjective fever, dysuria, increased urinary frequency and urgency, back pain radiating into her groin, and blood in her urine. Her urinalysis revealed trace leukocytes, negative nitrites, and trace blood. She did not have CVA tenderness on exam. It is most likely that she has UTI, but pyelonephritis and kidney stone are also possible. Follow-up on urine culture for further evaluation. Prescribed omnicef, which will cover UTI and pyelonephritis. Prescribed tamsulosin for a week, in case she does have a kidney stone. Follow-up at the end of this week or beginning of next if not improving. Advised pt on when to go to the ER.    Orders Placed This Encounter  Procedures  . Urine Culture  . POCT Urinalysis Dipstick   Meds ordered this encounter  Medications  . cefdinir (OMNICEF) 300 MG capsule    Sig: Take 1 capsule (300 mg total) by mouth 2 (two) times daily.    Dispense:  14 capsule    Refill:  0  . tamsulosin (FLOMAX) 0.4 MG CAPS capsule  Sig: Take 1 capsule (0.4 mg total) by mouth daily.    Dispense:  30 capsule    Refill:  3     Historical information moved to improve visibility of documentation.  Past Medical History:  Diagnosis Date  . Bipolar disorder ([facility])   . Colitis   . Hyperlipidemia    No past surgical history on file. Social History   Tobacco Use  . Smoking status: Never Smoker  . Smokeless tobacco: Never Used  Substance Use Topics  . Alcohol use: Yes   family history includes Breast cancer in her maternal aunt; Cancer in her father and mother; Hyperlipidemia in her father and mother.  Medications: Current Outpatient Medications  Medication Sig Dispense Refill  . ARIPiprazole (ABILIFY) 5 MG tablet Take 1 tablet (5 mg total) by mouth daily. 90  tablet 3  . atorvastatin (LIPITOR) 20 MG tablet Take 1 tablet (20 mg total) by mouth daily. 90 tablet 3  . b complex vitamins tablet Take 1 tablet by mouth daily.    Marland Kitchen BIOTIN PO Take by mouth.    . levonorgestrel (MIRENA) 20 MCG/24HR IUD 1 each by Intrauterine route once.    . Omega-3 Fatty Acids (FISH OIL PO) Take by mouth.    . cefdinir (OMNICEF) 300 MG capsule Take 1 capsule (300 mg total) by mouth 2 (two) times daily. 14 capsule 0  . tamsulosin (FLOMAX) 0.4 MG CAPS capsule Take 1 capsule (0.4 mg total) by mouth daily. 30 capsule 3   No current facility-administered medications for this visit.    No Known Allergies   Discussed warning signs or symptoms. Please see discharge instructions. Patient expresses understanding.   I personally was present and performed or re-performed the history, physical exam and medical decision-making activities of this service and have verified that the service and findings are accurately documented in the student's note. ___________________________________________ Clementeen Graham M.D., ABFM., CAQSM. Primary Care and Sports Medicine Adjunct Instructor of Family Medicine  [employer]

## 2018-11-04 NOTE — Patient Instructions (Signed)
Thank you for coming in today. This may be a UTI or kidney stone.  Start omnicef twice daily for 1 week for infection.  Start tamulosin daily to help a possible kidney stone pass.  Take this for about 1 week.  Recheck if not better Thursday, Friday, Monday.   If your belly pain worsens, or you have high fever, bad vomiting, blood in your stool or black tarry stool go to the Emergency Room.    Pyelonephritis, Adult Pyelonephritis is a kidney infection. The kidneys are organs that help clean your blood by moving waste out of your blood and into your pee (urine). This infection can happen quickly, or it can last for a long time. In most cases, it clears up with treatment and does not cause other problems. Follow these instructions at home: Medicines  Take over-the-counter and prescription medicines only as told by your doctor.  Take your antibiotic medicine as told by your doctor. Do not stop taking the medicine even if you start to feel better. General instructions  Drink enough fluid to keep your pee clear or pale yellow.  Avoid caffeine, tea, and carbonated drinks.  Pee (urinate) often. Avoid holding in pee for long periods of time.  Pee before and after sex.  After pooping (having a bowel movement), women should wipe from front to back. Use each tissue only once.  Keep all follow-up visits as told by your doctor. This is important. Contact a doctor if:  You do not feel better after 2 days.  Your symptoms get worse.  You have a fever. Get help right away if:  You cannot take your medicine or drink fluids as told.  You have chills and shaking.  You throw up (vomit).  You have very bad pain in your side (flank) or back.  You feel very weak or you pass out (faint). This information is not intended to replace advice given to you by your health care provider. Make sure you discuss any questions you have with your health care provider. Document Released: 12/13/2004  Document Revised: 04/12/2016 Document Reviewed: 02/28/2015 Elsevier Interactive Patient Education  2018 [employer]   Kidney Stones Kidney stones (urolithiasis) are rock-like masses that form inside of the kidneys. Kidneys are organs that make pee (urine). A kidney stone can cause very bad pain and can block the flow of pee. The stone usually leaves your body (passes) through your pee. You may need to have a doctor take out the stone. Follow these instructions at home: Eating and drinking  Drink enough fluid to keep your pee clear or pale yellow. This will help you pass the stone.  If told by your doctor, change the foods you eat (your diet). This may include: ? Limiting how much salt (sodium) you eat. ? Eating more fruits and vegetables. ? Limiting how much meat, poultry, fish, and eggs you eat.  Follow instructions from your doctor about eating or drinking restrictions. General instructions  Collect pee samples as told by your doctor. You may need to collect a pee sample: ? 24 hours after a stone comes out. ? 8-12 weeks after a stone comes out, and every 6-12 months after that.  Strain your pee every time you pee (urinate), for as long as told. Use the strainer that your doctor recommends.  Do not throw out the stone. Keep it so that it can be tested by your doctor.  Take over-the-counter and prescription medicines only as told by your doctor.  Keep all  follow-up visits as told by your doctor. This is important. You may need follow-up tests. Preventing kidney stones To prevent another kidney stone:  Drink enough fluid to keep your pee clear or pale yellow. This is the best way to prevent kidney stones.  Eat healthy foods.  Avoid certain foods as told by your doctor. You may be told to eat less protein.  Stay at a healthy weight.  Contact a doctor if:  You have pain that gets worse or does not get better with medicine. Get help right away if:  You have a fever or  chills.  You get very bad pain.  You get new pain in your belly (abdomen).  You pass out (faint).  You cannot pee. This information is not intended to replace advice given to you by your health care provider. Make sure you discuss any questions you have with your health care provider. Document Released: 04/23/2008 Document Revised: 07/24/2016 Document Reviewed: 07/24/2016 Elsevier Interactive Patient Education  2017 [employer].

## 2018-11-06 LAB — URINE CULTURE
MICRO NUMBER:: 91514830
SPECIMEN QUALITY:: ADEQUATE

## 2018-11-07 ENCOUNTER — Encounter: Payer: Self-pay | Admitting: Family Medicine

## 2018-11-20 ENCOUNTER — Encounter: Payer: Self-pay | Admitting: Physician Assistant

## 2018-11-27 ENCOUNTER — Encounter: Payer: Self-pay | Admitting: Physician Assistant

## 2018-12-20 NOTE — Progress Notes (Signed)
Follow up with patient and see if her pain is any better. PT sent note had not followed up with any appts.

## 2018-12-22 NOTE — Progress Notes (Signed)
Called pt, states she is feeling better and no longer needs PT. FYI to PCP

## 2019-02-23 ENCOUNTER — Telehealth: Payer: Managed Care, Other (non HMO) | Admitting: Family

## 2019-02-23 DIAGNOSIS — Z20822 Contact with and (suspected) exposure to covid-19: Secondary | ICD-10-CM

## 2019-02-23 DIAGNOSIS — R6889 Other general symptoms and signs: Principal | ICD-10-CM

## 2019-02-23 MED ORDER — BENZONATATE 100 MG PO CAPS: 100.0000 mg | ORAL_CAPSULE | Freq: Three times a day (TID) | ORAL | 0 refills | Status: DC | PRN

## 2019-02-23 NOTE — Progress Notes (Signed)
E-Visit for Corona Virus Screening  Based on your current symptoms, you may very well have the virus, however your symptoms are mild. Currently, not all patients are being tested. If the symptoms are mild and there is not a known exposure, performing the test is not indicated.  Coronavirus disease 2019 (COVID-19) is a respiratory illness that can spread from person to person. The virus that causes COVID-19 is a new virus that was first identified in the country of [place] but is now found in multiple other countries and has spread to the [place].  Symptoms associated with the virus are mild to severe fever, cough, and shortness of breath. There is currently no vaccine to protect against COVID-19, and there is no specific antiviral treatment for the virus.  Approximately 5 minutes was spent documenting and reviewing patient's chart.    To be considered HIGH RISK for Coronavirus (COVID-19), you have to meet the following criteria:  . Traveled to [place], [place], [place], [place] or [place]; or in the [place] to [place], [place], [place], or [place]; and have fever, cough, and shortness of breath within the last 2 weeks of travel OR  . Been in close contact with a person diagnosed with COVID-19 within the last 2 weeks and have fever, cough, and shortness of breath  . IF YOU DO NOT MEET THESE CRITERIA, YOU ARE CONSIDERED LOW RISK FOR COVID-19.   It is vitally important that if you feel that you have an infection such as this virus or any other virus that you stay home and away from places where you may spread it to others.  You should self-quarantine for 14 days if you have symptoms that could potentially be coronavirus and avoid contact with people age 25 and older.   You can use medication such as A prescription cough medication called Tessalon Perles 100 mg. You may take 1-2 capsules every 8 hours as needed for cough  You may also take acetaminophen (Tylenol) as needed for  fever.   Reduce your risk of any infection by using the same precautions used for avoiding the common cold or flu:  Marland Kitchen Wash your hands often with soap and warm water for at least 20 seconds.  If soap and water are not readily available, use an alcohol-based hand sanitizer with at least 60% alcohol.  . If coughing or sneezing, cover your mouth and nose by coughing or sneezing into the elbow areas of your shirt or coat, into a tissue or into your sleeve (not your hands). . Avoid shaking hands with others and consider head nods or verbal greetings only. . Avoid touching your eyes, nose, or mouth with unwashed hands.  . Avoid close contact with people who are sick. . Avoid places or events with large numbers of people in one location, like concerts or sporting events. . Carefully consider travel plans you have or are making. . If you are planning any travel outside or inside the [place], visit the CDC's Travelers' Health webpage for the latest health notices. . If you have some symptoms but not all symptoms, continue to monitor at home and seek medical attention if your symptoms worsen. . If you are having a medical emergency, call 911.  HOME CARE . Only take medications as instructed by your medical team. . Drink plenty of fluids and get plenty of rest. . A steam or ultrasonic humidifier can help if you have congestion.   GET HELP RIGHT AWAY IF: . You  You develop worsening fever. . You become short of breath . You cough up blood. . Your symptoms become more severe MAKE SURE YOU   Understand these instructions.  Will watch your condition.  Will get help right away if you are not doing well or get worse.  Your e-visit answers were reviewed by a board certified advanced clinical practitioner to complete your personal care plan.  Depending on the condition, your plan could have included both over the counter or prescription medications.  If there is a problem please reply once you have received a  response from your provider. Your safety is important to us.  If you have drug allergies check your prescription carefully.    You can use MyChart to ask questions about today's visit, request a non-urgent call back, or ask for a work or school excuse for 24 hours related to this e-Visit. If it has been greater than 24 hours you will need to follow up with your provider, or enter a new e-Visit to address those concerns. You will get an e-mail in the next two days asking about your experience.  I hope that your e-visit has been valuable and will speed your recovery. Thank you for using e-visits.    

## 2019-02-24 ENCOUNTER — Ambulatory Visit: Payer: Managed Care, Other (non HMO) | Admitting: Sports Medicine

## 2019-02-24 ENCOUNTER — Encounter: Payer: Self-pay | Admitting: Sports Medicine

## 2019-02-24 ENCOUNTER — Other Ambulatory Visit: Payer: Self-pay

## 2019-02-24 DIAGNOSIS — R1012 Left upper quadrant pain: Secondary | ICD-10-CM

## 2019-02-24 LAB — POCT URINALYSIS DIPSTICK
Bilirubin, UA: NEGATIVE
Blood, UA: NEGATIVE
Glucose, UA: NEGATIVE
Ketones, UA: NEGATIVE
Leukocytes, UA: NEGATIVE
Nitrite, UA: NEGATIVE
Protein, UA: NEGATIVE
Spec Grav, UA: 1.015 (ref 1.010–1.025)
Urobilinogen, UA: 0.2 E.U./dL
pH, UA: 7 (ref 5.0–8.0)

## 2019-02-24 MED ORDER — OMEPRAZOLE 40 MG PO CPDR: 40.0000 mg | DELAYED_RELEASE_CAPSULE | Freq: Two times a day (BID) | ORAL | 0 refills | Status: DC

## 2019-02-24 NOTE — Progress Notes (Signed)
Subjective:    CC: Acute abdominal pain  HPI: This is a pleasant 48 year old female, for the past week she has had left upper quadrant abdominal pain, low-grade fevers, increasing peristalsis, as well as slightly mucousy/slimy stools.  No melena, hematochezia, hematemesis.  Abdominal pain is not severe.  She does have a history of microscopic colitis, and feels as though this current presentation is very different.  I reviewed the past medical history, family history, social history, surgical history, and allergies today and no changes were needed.  Please see the problem list section below in epic for further details.  Past Medical History: Past Medical History:  Diagnosis Date  . Bipolar disorder (HCC)   . Colitis   . Hyperlipidemia    Past Surgical History: No past surgical history on file. Social History: Social History   Socioeconomic History  . Marital status: Single    Spouse name: Not on file  . Number of children: Not on file  . Years of education: Not on file  . Highest education level: Not on file  Occupational History  . Not on file  Social Needs  . Financial resource strain: Not on file  . Food insecurity:    Worry: Not on file    Inability: Not on file  . Transportation needs:    Medical: Not on file    Non-medical: Not on file  Tobacco Use  . Smoking status: Never Smoker  . Smokeless tobacco: Never Used  Substance and Sexual Activity  . Alcohol use: Yes  . Drug use: No  . Sexual activity: Yes  Lifestyle  . Physical activity:    Days per week: Not on file    Minutes per session: Not on file  . Stress: Not on file  Relationships  . Social connections:    Talks on phone: Not on file    Gets together: Not on file    Attends religious service: Not on file    Active member of club or organization: Not on file    Attends meetings of clubs or organizations: Not on file    Relationship status: Not on file  Other Topics Concern  . Not on file  Social  History Narrative  . Not on file   Family History: Family History  Problem Relation Age of Onset  . Cancer Mother   . Hyperlipidemia Mother   . Cancer Father   . Hyperlipidemia Father   . Breast cancer Maternal Aunt    Allergies: No Known Allergies Medications: See med rec.  Review of Systems: No fevers, chills, night sweats, weight loss, chest pain, or shortness of breath.   Objective:    General: Well Developed, well nourished, and in no acute distress.  Neuro: Alert and oriented x3, extra-ocular muscles intact, sensation grossly intact.  HEENT: Normocephalic, atraumatic, pupils equal round reactive to light, neck supple, no masses, no lymphadenopathy, thyroid nonpalpable.  Skin: Warm and dry, no rashes. Cardiac: Regular rate and rhythm, no murmurs rubs or gallops, no lower extremity edema.  Respiratory: Clear to auscultation bilaterally. Not using accessory muscles, speaking in full sentences. Abdomen: Soft, nontender, nondistended, no bowel sounds, no palpable masses, no guarding, rigidity, rebound tenderness.  Urinalysis is completely negative.  Impression and Recommendations:    Acute abdominal pain in left upper quadrant Patient tells me this is not presenting like her typical colitis. No hematochezia or melena, no hematemesis. Urinalysis was negative in the office. Belly is soft. Likely viral enteritis considering low-grade fever, excessive peristalsis  and minimally mucousy/slimy stools. Adding a CBC, CMP, amylase, lipase. Pantoprazole twice a day, Tylenol as needed. If no improvement after 1 week I will treat this with antibiotics.   ___________________________________________ Ihor Austin. Benjamin Stain, M.D., ABFM., CAQSM. Primary Care and Sports Medicine [facility]  Adjunct Professor of Family Medicine  [employer]

## 2019-02-24 NOTE — Patient Instructions (Signed)
Suspect viral enteritis

## 2019-02-24 NOTE — Assessment & Plan Note (Signed)
Patient tells me this is not presenting like her typical colitis. No hematochezia or melena, no hematemesis. Urinalysis was negative in the office. Belly is soft. Likely viral enteritis considering low-grade fever, excessive peristalsis and minimally mucousy/slimy stools. Adding a CBC, CMP, amylase, lipase. Pantoprazole twice a day, Tylenol as needed. If no improvement after 1 week I will treat this with antibiotics.

## 2019-02-25 LAB — COMPREHENSIVE METABOLIC PANEL
AG Ratio: 2 (calc) (ref 1.0–2.5)
ALT: 34 U/L — ABNORMAL HIGH (ref 6–29)
AST: 19 U/L (ref 10–35)
Albumin: 4.8 g/dL (ref 3.6–5.1)
Alkaline phosphatase (APISO): 85 U/L (ref 31–125)
BUN: 14 mg/dL (ref 7–25)
CO2: 27 mmol/L (ref 20–32)
Calcium: 9.9 mg/dL (ref 8.6–10.2)
Chloride: 102 mmol/L (ref 98–110)
Creat: 0.85 mg/dL (ref 0.50–1.10)
Globulin: 2.4 g/dL (calc) (ref 1.9–3.7)
Glucose, Bld: 103 mg/dL — ABNORMAL HIGH (ref 65–99)
Potassium: 4.1 mmol/L (ref 3.5–5.3)
Sodium: 138 mmol/L (ref 135–146)
Total Bilirubin: 0.5 mg/dL (ref 0.2–1.2)
Total Protein: 7.2 g/dL (ref 6.1–8.1)

## 2019-02-25 LAB — CBC WITH DIFFERENTIAL/PLATELET
Absolute Monocytes: 469 cells/uL (ref 200–950)
Basophils Absolute: 33 cells/uL (ref 0–200)
Basophils Relative: 0.5 %
Eosinophils Absolute: 178 cells/uL (ref 15–500)
Eosinophils Relative: 2.7 %
HCT: 46.6 % — ABNORMAL HIGH (ref 35.0–45.0)
Hemoglobin: 15.9 g/dL — ABNORMAL HIGH (ref 11.7–15.5)
Lymphs Abs: 2132 cells/uL (ref 850–3900)
MCH: 30.6 pg (ref 27.0–33.0)
MCHC: 34.1 g/dL (ref 32.0–36.0)
MCV: 89.8 fL (ref 80.0–100.0)
MPV: 11.4 fL (ref 7.5–12.5)
Monocytes Relative: 7.1 %
Neutro Abs: 3788 cells/uL (ref 1500–7800)
Neutrophils Relative %: 57.4 %
Platelets: 210 10*3/uL (ref 140–400)
RBC: 5.19 10*6/uL — ABNORMAL HIGH (ref 3.80–5.10)
RDW: 12.5 % (ref 11.0–15.0)
Total Lymphocyte: 32.3 %
WBC: 6.6 10*3/uL (ref 3.8–10.8)

## 2019-02-25 LAB — AMYLASE: Amylase: 38 U/L (ref 21–101)

## 2019-02-25 LAB — LIPASE: Lipase: 16 U/L (ref 7–60)

## 2019-02-26 ENCOUNTER — Encounter: Payer: Self-pay | Admitting: Sports Medicine

## 2019-02-26 DIAGNOSIS — R1012 Left upper quadrant pain: Secondary | ICD-10-CM

## 2019-02-26 MED ORDER — OMEPRAZOLE 40 MG PO CPDR: 40.0000 mg | DELAYED_RELEASE_CAPSULE | Freq: Every day | ORAL | 0 refills | Status: AC

## 2019-04-19 ENCOUNTER — Other Ambulatory Visit: Payer: Self-pay | Admitting: Physician Assistant

## 2019-04-19 DIAGNOSIS — F3181 Bipolar II disorder: Secondary | ICD-10-CM

## 2019-04-20 NOTE — Telephone Encounter (Signed)
[facility] requesting med refill for aripiprazole 5 mg. Med currently not listed in active rx list. If appropriate, pls send refill to pharmacy.

## 2019-04-20 NOTE — Telephone Encounter (Signed)
Confirm with patient. I don't see on med list.

## 2019-04-20 NOTE — Telephone Encounter (Signed)
Spoke with patient. She states she has transferred care to another PCP and doesn't not need. RX refused. Jade removed as patient's PCP.

## 2019-09-27 IMAGING — DX DG HIP (WITH OR WITHOUT PELVIS) 3-4V BILAT
5 series · 5 of 5 positions shown · non-contrast
Comparison: None.

CLINICAL DATA: Bilateral hip pain for several years

EXAM:
DG HIP (WITH OR WITHOUT PELVIS) 4V BILAT

[pelvis ap]
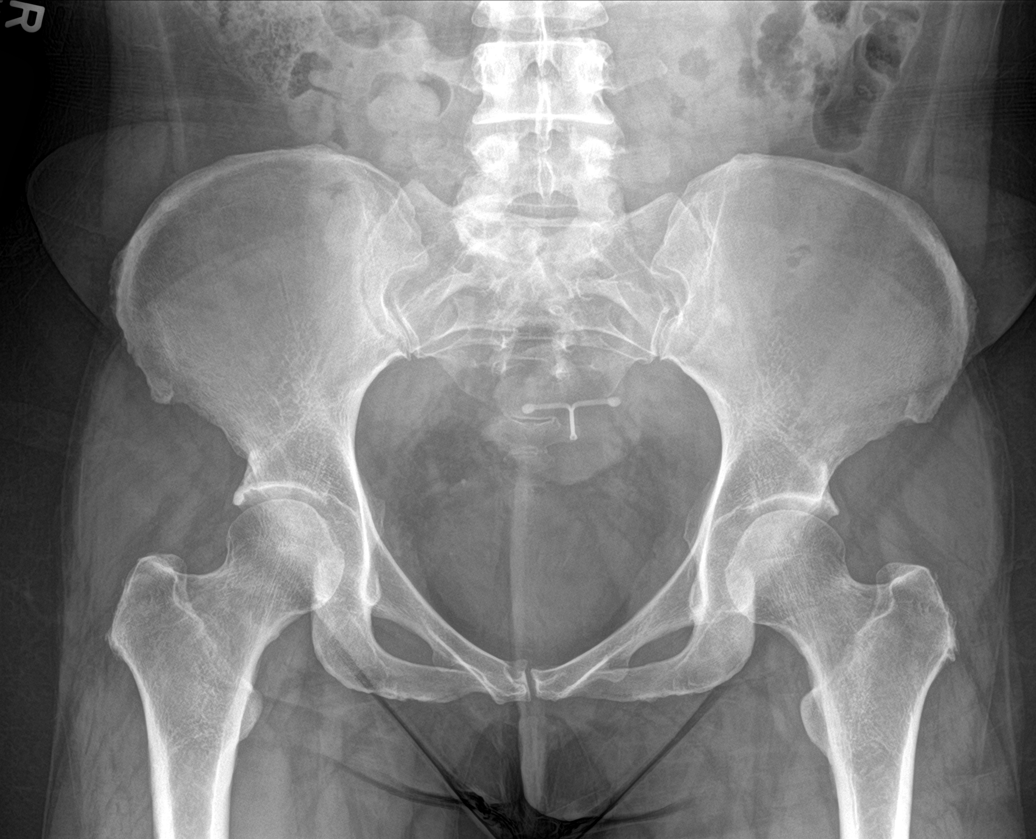

[hip ap (1 of 2)]
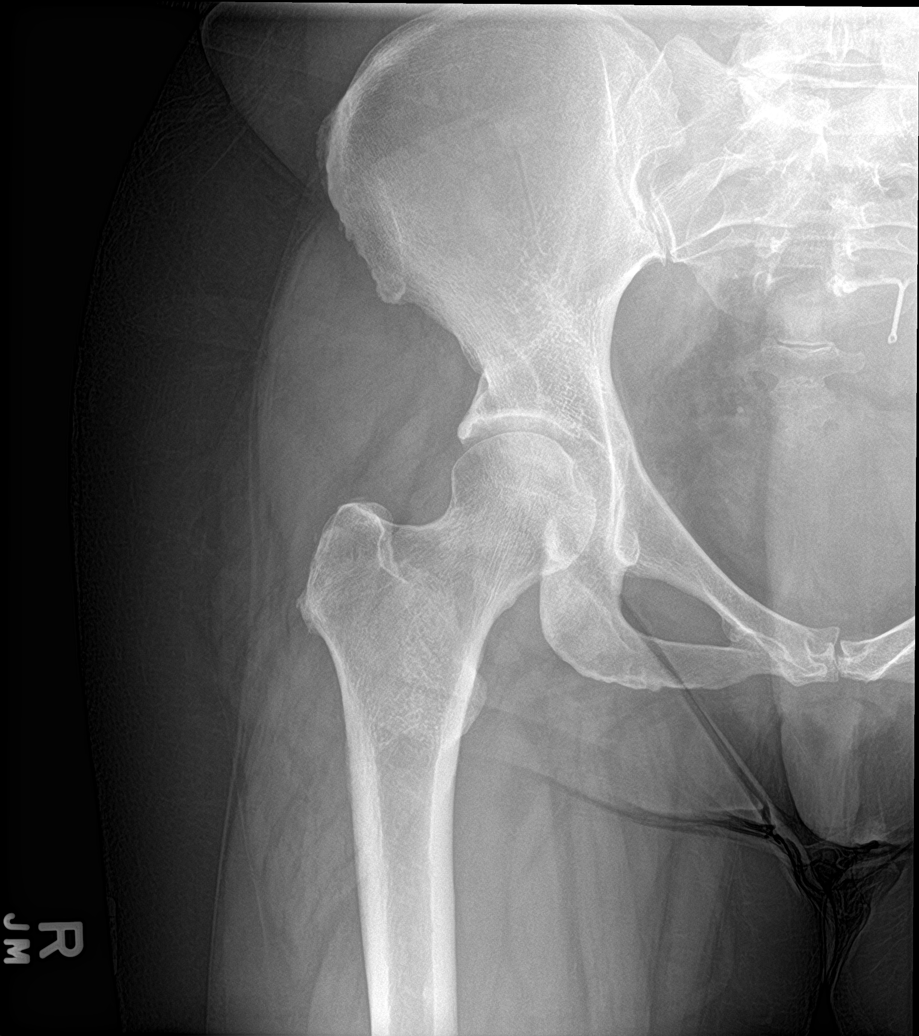

[hip frog leg (1 of 2)]
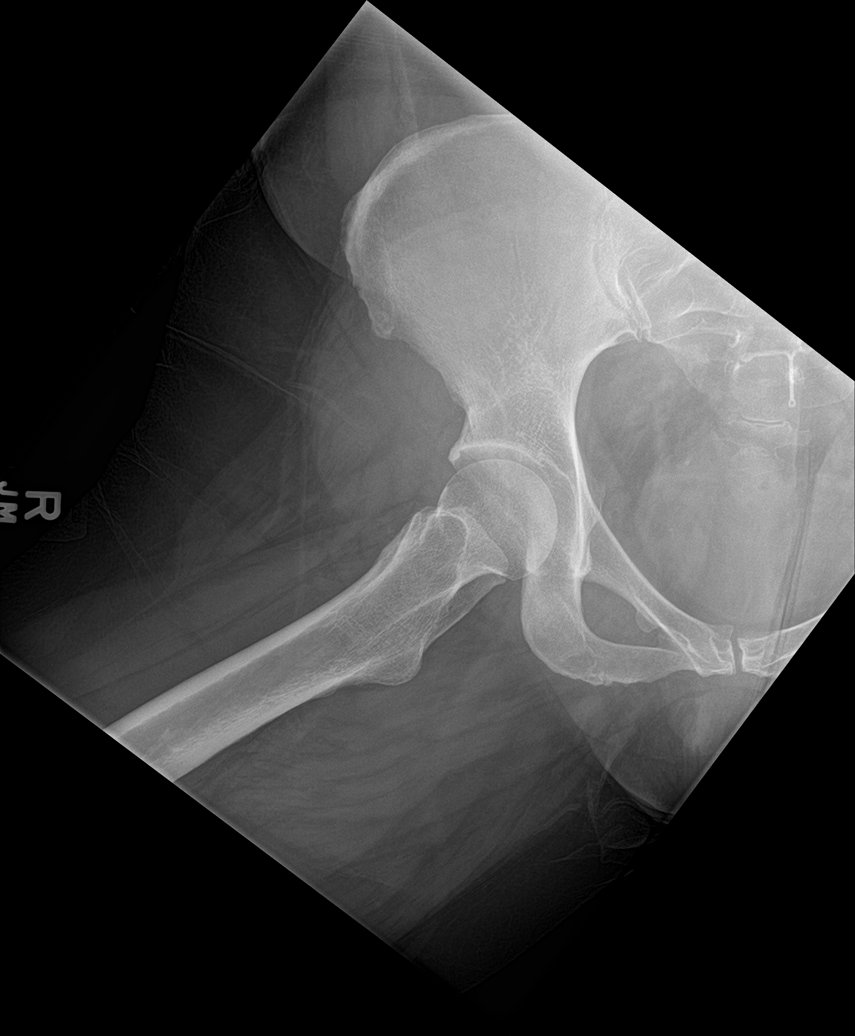

[hip ap (2 of 2)]
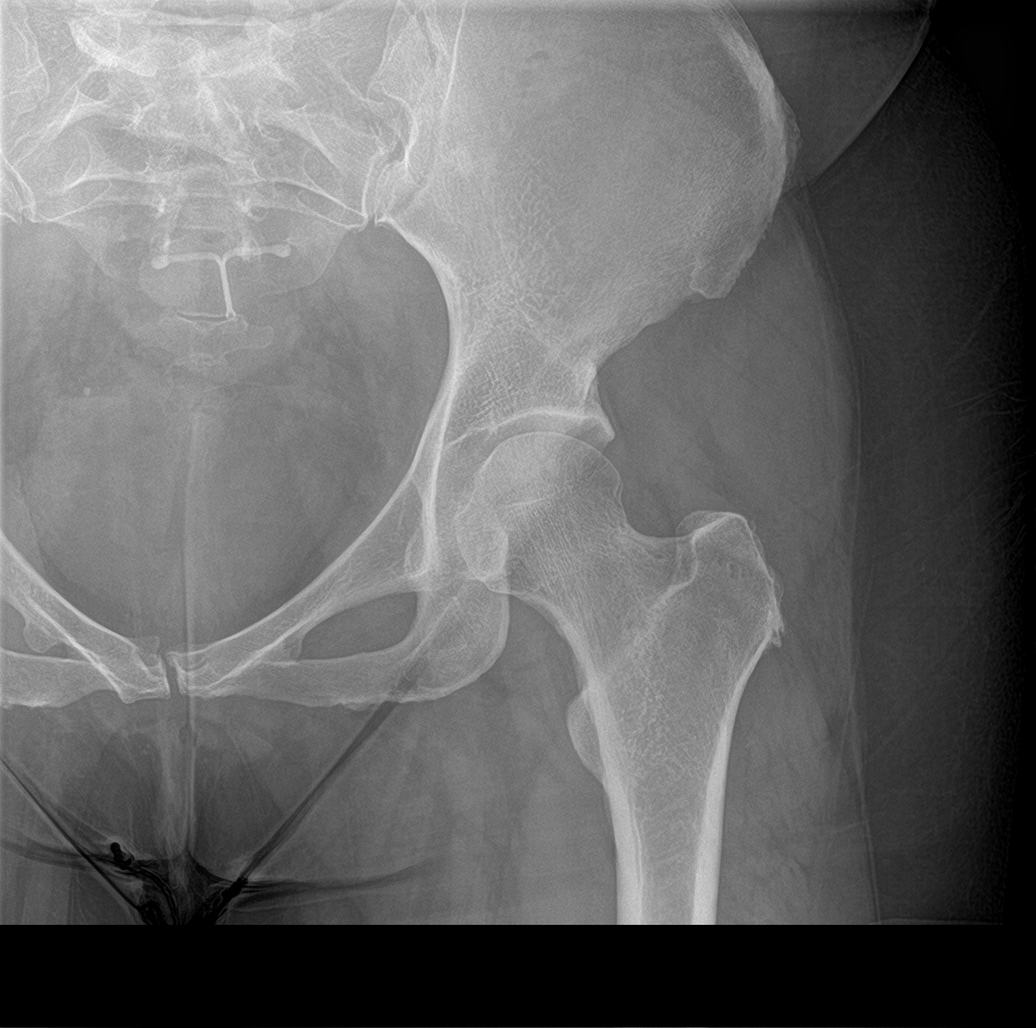

[hip frog leg (2 of 2)]
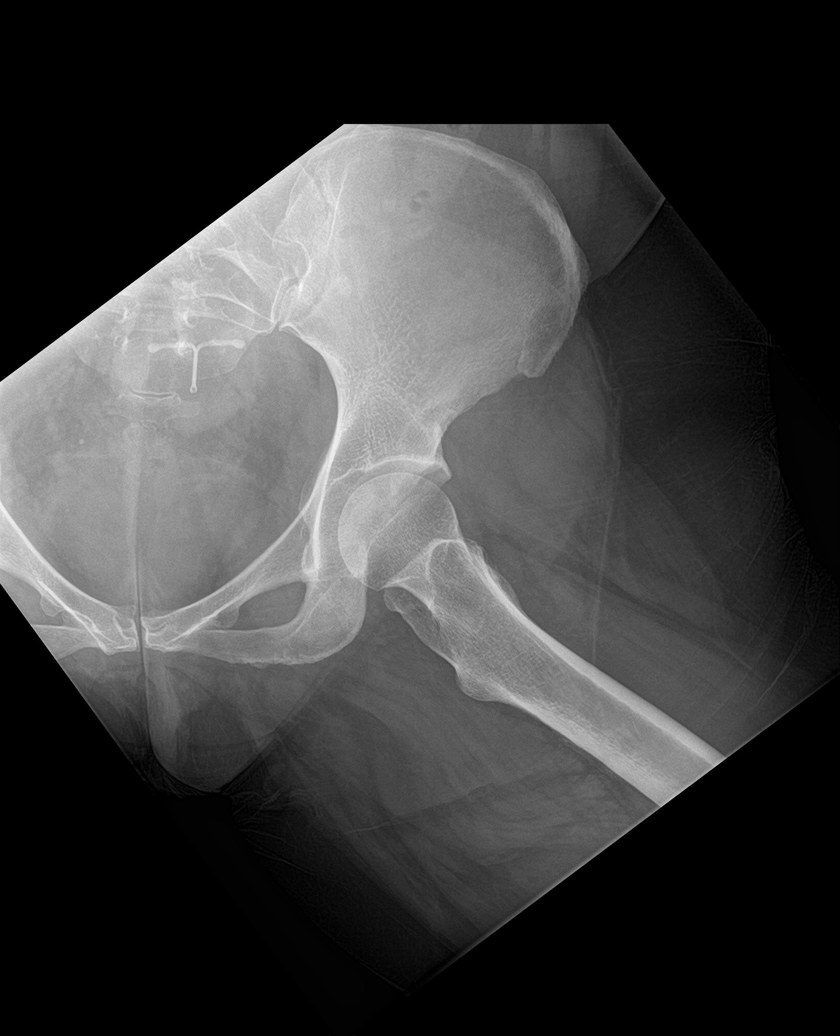

[5 of 5 positions shown; findings below may reference images not displayed]

FINDINGS: IUD is noted in place. Pelvic ring is intact. Mild degenerative
changes of the hip joints are noted bilaterally. No acute fracture
or dislocation is seen. No gross soft tissue abnormality is noted.
IMPRESSION: Mild degenerative change without acute abnormality.

## 2020-11-17 ENCOUNTER — Other Ambulatory Visit: Payer: Self-pay | Admitting: Physician Assistant

## 2020-11-17 DIAGNOSIS — E781 Pure hyperglyceridemia: Secondary | ICD-10-CM

## 2020-11-17 DIAGNOSIS — E782 Mixed hyperlipidemia: Secondary | ICD-10-CM
